# Patient Record
Sex: Male | Born: 1945 | Race: White | Hispanic: No | Marital: Married | Smoking: Former smoker
Health system: Southern US, Community
[De-identification: ages and names within clinical notes are randomized; demographics above are authoritative.]

## PROBLEM LIST (undated history)

## (undated) DIAGNOSIS — J449 Chronic obstructive pulmonary disease, unspecified: Secondary | ICD-10-CM

## (undated) DIAGNOSIS — N189 Chronic kidney disease, unspecified: Secondary | ICD-10-CM

## (undated) DIAGNOSIS — C801 Malignant (primary) neoplasm, unspecified: Secondary | ICD-10-CM

## (undated) DIAGNOSIS — H269 Unspecified cataract: Secondary | ICD-10-CM

## (undated) HISTORY — PX: EYE SURGERY: SHX253

## (undated) HISTORY — PX: BLADDER REMOVAL: SHX567

## (undated) HISTORY — DX: Chronic obstructive pulmonary disease, unspecified: J44.9

## (undated) HISTORY — DX: Unspecified cataract: H26.9

## (undated) HISTORY — PX: TONSILLECTOMY: SUR1361

## (undated) HISTORY — DX: Chronic kidney disease, unspecified: N18.9

## (undated) HISTORY — PX: VASECTOMY: SHX75

## (undated) HISTORY — PX: PROSTATECTOMY: SHX69

## (undated) HISTORY — DX: Malignant (primary) neoplasm, unspecified: C80.1

---

## 1999-05-13 ENCOUNTER — Ambulatory Visit (HOSPITAL_BASED_OUTPATIENT_CLINIC_OR_DEPARTMENT_OTHER): Admission: RE | Admit: 1999-05-13 | Discharge: 1999-05-13 | Payer: Self-pay | Admitting: Urology

## 1999-07-18 ENCOUNTER — Other Ambulatory Visit: Admission: RE | Admit: 1999-07-18 | Discharge: 1999-07-18 | Payer: Self-pay | Admitting: Urology

## 1999-07-24 ENCOUNTER — Encounter: Payer: Self-pay | Admitting: Urology

## 1999-07-24 ENCOUNTER — Encounter: Admission: RE | Admit: 1999-07-24 | Discharge: 1999-07-24 | Payer: Self-pay | Admitting: Urology

## 1999-08-02 ENCOUNTER — Other Ambulatory Visit: Admission: RE | Admit: 1999-08-02 | Discharge: 1999-08-02 | Payer: Self-pay | Admitting: Urology

## 1999-10-07 ENCOUNTER — Emergency Department (HOSPITAL_COMMUNITY): Admission: EM | Admit: 1999-10-07 | Discharge: 1999-10-07 | Payer: Self-pay | Admitting: *Deleted

## 1999-10-09 ENCOUNTER — Encounter: Payer: Self-pay | Admitting: Urology

## 1999-10-09 ENCOUNTER — Observation Stay (HOSPITAL_COMMUNITY): Admission: RE | Admit: 1999-10-09 | Discharge: 1999-10-10 | Payer: Self-pay | Admitting: Urology

## 1999-12-25 ENCOUNTER — Observation Stay (HOSPITAL_COMMUNITY): Admission: RE | Admit: 1999-12-25 | Discharge: 1999-12-26 | Payer: Self-pay | Admitting: Urology

## 2003-12-18 ENCOUNTER — Encounter: Admission: RE | Admit: 2003-12-18 | Discharge: 2004-03-17 | Payer: Self-pay | Admitting: Family Medicine

## 2004-03-18 ENCOUNTER — Encounter: Admission: RE | Admit: 2004-03-18 | Discharge: 2004-03-29 | Payer: Self-pay | Admitting: Family Medicine

## 2011-04-16 ENCOUNTER — Other Ambulatory Visit (HOSPITAL_COMMUNITY): Payer: Self-pay | Admitting: Family Medicine

## 2011-04-16 DIAGNOSIS — R9431 Abnormal electrocardiogram [ECG] [EKG]: Secondary | ICD-10-CM

## 2011-04-18 ENCOUNTER — Ambulatory Visit (HOSPITAL_COMMUNITY): Payer: Medicare Other | Attending: Cardiovascular Disease | Admitting: Radiology

## 2011-04-18 DIAGNOSIS — R5381 Other malaise: Secondary | ICD-10-CM | POA: Insufficient documentation

## 2011-04-18 DIAGNOSIS — R5383 Other fatigue: Secondary | ICD-10-CM | POA: Insufficient documentation

## 2011-04-18 DIAGNOSIS — I079 Rheumatic tricuspid valve disease, unspecified: Secondary | ICD-10-CM | POA: Insufficient documentation

## 2011-04-18 DIAGNOSIS — I059 Rheumatic mitral valve disease, unspecified: Secondary | ICD-10-CM | POA: Insufficient documentation

## 2011-04-18 DIAGNOSIS — I319 Disease of pericardium, unspecified: Secondary | ICD-10-CM | POA: Insufficient documentation

## 2011-04-18 DIAGNOSIS — R9431 Abnormal electrocardiogram [ECG] [EKG]: Secondary | ICD-10-CM | POA: Insufficient documentation

## 2011-04-18 DIAGNOSIS — I379 Nonrheumatic pulmonary valve disorder, unspecified: Secondary | ICD-10-CM | POA: Insufficient documentation

## 2011-04-21 ENCOUNTER — Encounter (HOSPITAL_COMMUNITY): Payer: Self-pay | Admitting: *Deleted

## 2011-11-11 DIAGNOSIS — R197 Diarrhea, unspecified: Secondary | ICD-10-CM | POA: Diagnosis not present

## 2011-11-11 DIAGNOSIS — Z79899 Other long term (current) drug therapy: Secondary | ICD-10-CM | POA: Diagnosis not present

## 2011-11-11 DIAGNOSIS — I10 Essential (primary) hypertension: Secondary | ICD-10-CM | POA: Diagnosis not present

## 2011-11-11 DIAGNOSIS — E785 Hyperlipidemia, unspecified: Secondary | ICD-10-CM | POA: Diagnosis not present

## 2011-11-11 DIAGNOSIS — E559 Vitamin D deficiency, unspecified: Secondary | ICD-10-CM | POA: Diagnosis not present

## 2011-11-25 DIAGNOSIS — R197 Diarrhea, unspecified: Secondary | ICD-10-CM | POA: Diagnosis not present

## 2011-12-09 DIAGNOSIS — L280 Lichen simplex chronicus: Secondary | ICD-10-CM | POA: Diagnosis not present

## 2011-12-09 DIAGNOSIS — L259 Unspecified contact dermatitis, unspecified cause: Secondary | ICD-10-CM | POA: Diagnosis not present

## 2011-12-09 DIAGNOSIS — Z85820 Personal history of malignant melanoma of skin: Secondary | ICD-10-CM | POA: Diagnosis not present

## 2011-12-09 DIAGNOSIS — D235 Other benign neoplasm of skin of trunk: Secondary | ICD-10-CM | POA: Diagnosis not present

## 2011-12-09 DIAGNOSIS — L820 Inflamed seborrheic keratosis: Secondary | ICD-10-CM | POA: Diagnosis not present

## 2011-12-09 DIAGNOSIS — Z85828 Personal history of other malignant neoplasm of skin: Secondary | ICD-10-CM | POA: Diagnosis not present

## 2011-12-09 DIAGNOSIS — D485 Neoplasm of uncertain behavior of skin: Secondary | ICD-10-CM | POA: Diagnosis not present

## 2011-12-24 DIAGNOSIS — K219 Gastro-esophageal reflux disease without esophagitis: Secondary | ICD-10-CM | POA: Diagnosis not present

## 2012-01-19 DIAGNOSIS — E785 Hyperlipidemia, unspecified: Secondary | ICD-10-CM | POA: Diagnosis not present

## 2012-01-19 DIAGNOSIS — I10 Essential (primary) hypertension: Secondary | ICD-10-CM | POA: Diagnosis not present

## 2012-02-17 DIAGNOSIS — C679 Malignant neoplasm of bladder, unspecified: Secondary | ICD-10-CM | POA: Diagnosis not present

## 2012-02-17 DIAGNOSIS — Z8546 Personal history of malignant neoplasm of prostate: Secondary | ICD-10-CM | POA: Diagnosis not present

## 2012-03-30 DIAGNOSIS — N189 Chronic kidney disease, unspecified: Secondary | ICD-10-CM | POA: Diagnosis not present

## 2012-03-30 DIAGNOSIS — I10 Essential (primary) hypertension: Secondary | ICD-10-CM | POA: Diagnosis not present

## 2012-03-30 DIAGNOSIS — E785 Hyperlipidemia, unspecified: Secondary | ICD-10-CM | POA: Diagnosis not present

## 2012-03-30 DIAGNOSIS — N390 Urinary tract infection, site not specified: Secondary | ICD-10-CM | POA: Diagnosis not present

## 2012-03-30 DIAGNOSIS — Z125 Encounter for screening for malignant neoplasm of prostate: Secondary | ICD-10-CM | POA: Diagnosis not present

## 2012-03-30 DIAGNOSIS — Z79899 Other long term (current) drug therapy: Secondary | ICD-10-CM | POA: Diagnosis not present

## 2012-05-03 DIAGNOSIS — R42 Dizziness and giddiness: Secondary | ICD-10-CM | POA: Diagnosis not present

## 2012-05-03 DIAGNOSIS — C679 Malignant neoplasm of bladder, unspecified: Secondary | ICD-10-CM | POA: Diagnosis not present

## 2012-05-27 DIAGNOSIS — C679 Malignant neoplasm of bladder, unspecified: Secondary | ICD-10-CM | POA: Diagnosis not present

## 2012-06-23 DIAGNOSIS — K219 Gastro-esophageal reflux disease without esophagitis: Secondary | ICD-10-CM | POA: Diagnosis not present

## 2012-07-26 DIAGNOSIS — Z23 Encounter for immunization: Secondary | ICD-10-CM | POA: Diagnosis not present

## 2012-08-25 DIAGNOSIS — K2270 Barrett's esophagus without dysplasia: Secondary | ICD-10-CM | POA: Diagnosis not present

## 2012-09-21 DIAGNOSIS — A048 Other specified bacterial intestinal infections: Secondary | ICD-10-CM | POA: Diagnosis not present

## 2012-09-21 DIAGNOSIS — Z01818 Encounter for other preprocedural examination: Secondary | ICD-10-CM | POA: Diagnosis not present

## 2012-09-21 DIAGNOSIS — K2270 Barrett's esophagus without dysplasia: Secondary | ICD-10-CM | POA: Diagnosis not present

## 2012-10-01 DIAGNOSIS — K2270 Barrett's esophagus without dysplasia: Secondary | ICD-10-CM | POA: Diagnosis not present

## 2012-10-01 DIAGNOSIS — K200 Eosinophilic esophagitis: Secondary | ICD-10-CM | POA: Diagnosis not present

## 2012-10-01 DIAGNOSIS — K2970 Gastritis, unspecified, without bleeding: Secondary | ICD-10-CM | POA: Diagnosis not present

## 2012-10-01 DIAGNOSIS — A048 Other specified bacterial intestinal infections: Secondary | ICD-10-CM | POA: Diagnosis not present

## 2012-10-01 DIAGNOSIS — K2990 Gastroduodenitis, unspecified, without bleeding: Secondary | ICD-10-CM | POA: Diagnosis not present

## 2012-10-15 DIAGNOSIS — K219 Gastro-esophageal reflux disease without esophagitis: Secondary | ICD-10-CM | POA: Diagnosis not present

## 2012-10-15 DIAGNOSIS — K2270 Barrett's esophagus without dysplasia: Secondary | ICD-10-CM | POA: Diagnosis not present

## 2012-10-26 DIAGNOSIS — L509 Urticaria, unspecified: Secondary | ICD-10-CM | POA: Diagnosis not present

## 2012-10-31 DIAGNOSIS — R21 Rash and other nonspecific skin eruption: Secondary | ICD-10-CM | POA: Diagnosis not present

## 2012-10-31 DIAGNOSIS — E7800 Pure hypercholesterolemia, unspecified: Secondary | ICD-10-CM | POA: Diagnosis not present

## 2012-10-31 DIAGNOSIS — Z9889 Other specified postprocedural states: Secondary | ICD-10-CM | POA: Diagnosis not present

## 2012-10-31 DIAGNOSIS — K219 Gastro-esophageal reflux disease without esophagitis: Secondary | ICD-10-CM | POA: Diagnosis not present

## 2012-10-31 DIAGNOSIS — I10 Essential (primary) hypertension: Secondary | ICD-10-CM | POA: Diagnosis not present

## 2012-10-31 DIAGNOSIS — Z859 Personal history of malignant neoplasm, unspecified: Secondary | ICD-10-CM | POA: Diagnosis not present

## 2012-10-31 DIAGNOSIS — T7840XA Allergy, unspecified, initial encounter: Secondary | ICD-10-CM | POA: Diagnosis not present

## 2012-10-31 DIAGNOSIS — Z9089 Acquired absence of other organs: Secondary | ICD-10-CM | POA: Diagnosis not present

## 2013-01-30 ENCOUNTER — Other Ambulatory Visit: Payer: Self-pay | Admitting: Family Medicine

## 2013-01-31 NOTE — Telephone Encounter (Signed)
Rx called to [facility].  Daughter aware.

## 2013-01-31 NOTE — Telephone Encounter (Signed)
LAST OV FOR CHECKUP 3/13. LAST OV FOR RASH 1/14

## 2013-01-31 NOTE — Telephone Encounter (Signed)
Okay to refill? 

## 2013-02-21 ENCOUNTER — Other Ambulatory Visit: Payer: Self-pay | Admitting: Nurse Practitioner

## 2013-02-21 ENCOUNTER — Telehealth: Payer: Self-pay | Admitting: *Deleted

## 2013-02-21 MED ORDER — DOXYCYCLINE HYCLATE 100 MG PO TABS: 100.0000 mg | ORAL_TABLET | Freq: Two times a day (BID) | ORAL | Status: DC

## 2013-02-21 NOTE — Telephone Encounter (Signed)
Pt aware.

## 2013-02-21 NOTE — Telephone Encounter (Signed)
Pt has removed 2 ticks and the area is really red and swollen can we please call in antibiotic.

## 2013-02-21 NOTE — Telephone Encounter (Signed)
rx for doxycycline called in

## 2013-03-25 DIAGNOSIS — M542 Cervicalgia: Secondary | ICD-10-CM | POA: Diagnosis not present

## 2013-03-25 DIAGNOSIS — Z9079 Acquired absence of other genital organ(s): Secondary | ICD-10-CM | POA: Diagnosis not present

## 2013-03-25 DIAGNOSIS — K219 Gastro-esophageal reflux disease without esophagitis: Secondary | ICD-10-CM | POA: Diagnosis not present

## 2013-03-25 DIAGNOSIS — R6884 Jaw pain: Secondary | ICD-10-CM | POA: Diagnosis not present

## 2013-03-25 DIAGNOSIS — E785 Hyperlipidemia, unspecified: Secondary | ICD-10-CM | POA: Diagnosis not present

## 2013-03-25 DIAGNOSIS — Z8249 Family history of ischemic heart disease and other diseases of the circulatory system: Secondary | ICD-10-CM | POA: Diagnosis not present

## 2013-03-25 DIAGNOSIS — Z79899 Other long term (current) drug therapy: Secondary | ICD-10-CM | POA: Diagnosis not present

## 2013-03-25 DIAGNOSIS — R072 Precordial pain: Secondary | ICD-10-CM | POA: Diagnosis not present

## 2013-03-25 DIAGNOSIS — E7800 Pure hypercholesterolemia, unspecified: Secondary | ICD-10-CM | POA: Diagnosis not present

## 2013-03-25 DIAGNOSIS — R0789 Other chest pain: Secondary | ICD-10-CM | POA: Diagnosis not present

## 2013-03-25 DIAGNOSIS — I10 Essential (primary) hypertension: Secondary | ICD-10-CM | POA: Diagnosis not present

## 2013-03-25 DIAGNOSIS — Z8509 Personal history of malignant neoplasm of other digestive organs: Secondary | ICD-10-CM | POA: Diagnosis not present

## 2013-03-25 DIAGNOSIS — F17200 Nicotine dependence, unspecified, uncomplicated: Secondary | ICD-10-CM | POA: Diagnosis not present

## 2013-03-25 DIAGNOSIS — R079 Chest pain, unspecified: Secondary | ICD-10-CM | POA: Diagnosis not present

## 2013-03-25 DIAGNOSIS — Z8551 Personal history of malignant neoplasm of bladder: Secondary | ICD-10-CM | POA: Diagnosis not present

## 2013-03-25 DIAGNOSIS — Z8546 Personal history of malignant neoplasm of prostate: Secondary | ICD-10-CM | POA: Diagnosis not present

## 2013-04-11 DIAGNOSIS — K219 Gastro-esophageal reflux disease without esophagitis: Secondary | ICD-10-CM | POA: Diagnosis not present

## 2013-04-11 DIAGNOSIS — K2270 Barrett's esophagus without dysplasia: Secondary | ICD-10-CM | POA: Diagnosis not present

## 2013-04-11 DIAGNOSIS — Z86010 Personal history of colonic polyps: Secondary | ICD-10-CM | POA: Diagnosis not present

## 2013-04-11 DIAGNOSIS — Z79899 Other long term (current) drug therapy: Secondary | ICD-10-CM | POA: Diagnosis not present

## 2013-05-02 DIAGNOSIS — J309 Allergic rhinitis, unspecified: Secondary | ICD-10-CM | POA: Diagnosis not present

## 2013-05-02 DIAGNOSIS — R197 Diarrhea, unspecified: Secondary | ICD-10-CM | POA: Diagnosis not present

## 2013-05-09 DIAGNOSIS — D129 Benign neoplasm of anus and anal canal: Secondary | ICD-10-CM | POA: Diagnosis not present

## 2013-05-09 DIAGNOSIS — Z01818 Encounter for other preprocedural examination: Secondary | ICD-10-CM | POA: Diagnosis not present

## 2013-05-09 DIAGNOSIS — K5289 Other specified noninfective gastroenteritis and colitis: Secondary | ICD-10-CM | POA: Diagnosis not present

## 2013-05-09 DIAGNOSIS — D128 Benign neoplasm of rectum: Secondary | ICD-10-CM | POA: Diagnosis not present

## 2013-05-09 DIAGNOSIS — K6389 Other specified diseases of intestine: Secondary | ICD-10-CM | POA: Diagnosis not present

## 2013-05-09 DIAGNOSIS — D126 Benign neoplasm of colon, unspecified: Secondary | ICD-10-CM | POA: Diagnosis not present

## 2013-05-23 DIAGNOSIS — Z800 Family history of malignant neoplasm of digestive organs: Secondary | ICD-10-CM | POA: Diagnosis not present

## 2013-05-23 DIAGNOSIS — K2270 Barrett's esophagus without dysplasia: Secondary | ICD-10-CM | POA: Diagnosis not present

## 2013-05-23 DIAGNOSIS — K219 Gastro-esophageal reflux disease without esophagitis: Secondary | ICD-10-CM | POA: Diagnosis not present

## 2013-05-23 DIAGNOSIS — Z86010 Personal history of colonic polyps: Secondary | ICD-10-CM | POA: Diagnosis not present

## 2013-06-30 ENCOUNTER — Ambulatory Visit (INDEPENDENT_AMBULATORY_CARE_PROVIDER_SITE_OTHER): Payer: Medicare Other | Admitting: Nurse Practitioner

## 2013-06-30 ENCOUNTER — Encounter: Payer: Self-pay | Admitting: Nurse Practitioner

## 2013-06-30 VITALS — BP 145/81 | HR 67 | Temp 98.3°F | Ht 69.0 in | Wt 232.4 lb

## 2013-06-30 DIAGNOSIS — I10 Essential (primary) hypertension: Secondary | ICD-10-CM | POA: Insufficient documentation

## 2013-06-30 DIAGNOSIS — K5190 Ulcerative colitis, unspecified, without complications: Secondary | ICD-10-CM | POA: Diagnosis not present

## 2013-06-30 DIAGNOSIS — K219 Gastro-esophageal reflux disease without esophagitis: Secondary | ICD-10-CM | POA: Diagnosis not present

## 2013-06-30 DIAGNOSIS — Z23 Encounter for immunization: Secondary | ICD-10-CM | POA: Diagnosis not present

## 2013-06-30 DIAGNOSIS — E785 Hyperlipidemia, unspecified: Secondary | ICD-10-CM

## 2013-06-30 MED ORDER — LISINOPRIL-HYDROCHLOROTHIAZIDE 10-12.5 MG PO TABS: 1.0000 | ORAL_TABLET | Freq: Every day | ORAL | Status: DC

## 2013-06-30 MED ORDER — ATORVASTATIN CALCIUM 20 MG PO TABS: 20.0000 mg | ORAL_TABLET | Freq: Every day | ORAL | Status: DC

## 2013-06-30 MED ORDER — OMEPRAZOLE 40 MG PO CPDR: 40.0000 mg | DELAYED_RELEASE_CAPSULE | Freq: Every day | ORAL | Status: DC

## 2013-06-30 MED ORDER — VARENICLINE TARTRATE 0.5 MG X 11 & 1 MG X 42 PO MISC: ORAL | Status: DC

## 2013-06-30 MED ORDER — VARENICLINE TARTRATE 1 MG PO TABS: 1.0000 mg | ORAL_TABLET | Freq: Two times a day (BID) | ORAL | Status: DC

## 2013-06-30 NOTE — Progress Notes (Signed)
Subjective:    Patient ID: Scott Bird, male    DOB: 11-11-1945, 67 y.o.   MRN: [id number]  Hypertension This is a chronic problem. The current episode started more than 1 year ago. The problem is unchanged. The problem is controlled (running in the 120's systolic at home). Pertinent negatives include no blurred vision, chest pain, headaches, neck pain, palpitations, peripheral edema, shortness of breath or sweats. Risk factors for coronary artery disease include family history, dyslipidemia and male gender. Past treatments include ACE inhibitors and diuretics. The current treatment provides moderate improvement. Compliance problems include diet and exercise.  Hypertensive end-organ damage includes CAD/MI.  Hyperlipidemia This is a chronic problem. The current episode started more than 1 year ago. The problem is controlled. Recent lipid tests were reviewed and are normal. He has no history of diabetes, hypothyroidism or obesity. Factors aggravating his hyperlipidemia include thiazides. Pertinent negatives include no chest pain or shortness of breath. Current antihyperlipidemic treatment includes statins. The current treatment provides moderate improvement of lipids. Compliance problems include adherence to diet and adherence to exercise.  Risk factors for coronary artery disease include hypertension and male sex.  GERD Omeprazole working well to keep symptoms under control. Ulcerative colitis Patient is currently on lialda 1.2gm which he does not see any difference- he has intermittent diarrhea. He stooped it for the last week and hasn't had any problems. Has follow up with GI in 1 week.  Review of Systems  HENT: Negative for neck pain.   Eyes: Negative for blurred vision.  Respiratory: Negative for shortness of breath.   Cardiovascular: Negative for chest pain and palpitations.  Neurological: Negative for headaches.       Objective:   Physical Exam  Constitutional: He is oriented to  person, place, and time. He appears well-developed and well-nourished.  HENT:  Head: Normocephalic.  Right Ear: External ear normal.  Left Ear: External ear normal.  Nose: Nose normal.  Mouth/Throat: Oropharynx is clear and moist.  Eyes: EOM are normal. Pupils are equal, round, and reactive to light.  Neck: Normal range of motion. Neck supple. No thyromegaly present.  Cardiovascular: Normal rate, regular rhythm, normal heart sounds and intact distal pulses.   No murmur heard. Pulmonary/Chest: Effort normal and breath sounds normal. He has no wheezes. He has no rales.  Abdominal: Soft. Bowel sounds are normal.  Genitourinary:  Rectal exam done by urologist  Musculoskeletal: Normal range of motion.  Neurological: He is alert and oriented to person, place, and time.  Skin: Skin is warm and dry.  Psychiatric: He has a normal mood and affect. His behavior is normal. Judgment and thought content normal.   BP 145/81  Pulse 67  Temp(Src) 98.3 F (36.8 C) (Oral)  Ht 5\' 9"  (1.753 m)  Wt 232 lb 6.4 oz (105.416 kg)  BMI 34.3 kg/m2        Assessment & Plan:   1. Hypertension   2. Hyperlipidemia   3. GERD (gastroesophageal reflux disease)   4. Ulcerative colitis    Orders Placed This Encounter  Procedures  . CMP14+EGFR  . NMR, lipoprofile   Meds ordered this encounter  Medications  . DISCONTD: omeprazole (PRILOSEC) 40 MG capsule    Sig:   . DISCONTD: atorvastatin (LIPITOR) 20 MG tablet    Sig: Take 20 mg by mouth daily.  . mesalamine (LIALDA) 1.2 G EC tablet    Sig: Take 1,200 mg by mouth daily with breakfast.  . omeprazole (PRILOSEC) 40 MG capsule  Sig: Take 1 capsule (40 mg total) by mouth daily.    Dispense:  90 capsule    Refill:  1    Order Specific Question:  Supervising Provider    Answer:  Ernestina Penna [[id number]]  . lisinopril-hydrochlorothiazide (PRINZIDE,ZESTORETIC) 10-12.5 MG per tablet    Sig: Take 1 tablet by mouth daily.    Dispense:  90 tablet    Refill:   1    Order Specific Question:  Supervising Provider    Answer:  Ernestina Penna [[id number]]  . atorvastatin (LIPITOR) 20 MG tablet    Sig: Take 1 tablet (20 mg total) by mouth daily.    Dispense:  90 tablet    Refill:  1    Order Specific Question:  Supervising Provider    Answer:  Ernestina Penna [[id number]]  . varenicline (CHANTIX STARTING MONTH PAK) 0.5 MG X 11 & 1 MG X 42 tablet    Sig: Take one 0.5 mg tablet by mouth once daily for 3 days, then increase to one 0.5 mg tablet twice daily for 4 days, then increase to one 1 mg tablet twice daily.    Dispense:  53 tablet    Refill:  0    Order Specific Question:  Supervising Provider    Answer:  Ernestina Penna [[id number]]  . varenicline (CHANTIX CONTINUING MONTH PAK) 1 MG tablet    Sig: Take 1 tablet (1 mg total) by mouth 2 (two) times daily.    Dispense:  1 tablet    Refill:  3    Order Specific Question:  Supervising Provider    Answer:  Deborra Medina    Continue all meds Labs pending Diet and exercise encouraged Health maintenance reviewed Follow up in 3 months  Mary-Margaret Daphine Deutscher, FNP

## 2013-06-30 NOTE — Patient Instructions (Signed)
Health Maintenance, Males A healthy lifestyle and preventative care can promote health and wellness.  Maintain regular health, dental, and eye exams.  Eat a healthy diet. Foods like vegetables, fruits, whole grains, low-fat dairy products, and lean protein foods contain the nutrients you need without too many calories. Decrease your intake of foods high in solid fats, added sugars, and salt. Get information about a proper diet from your caregiver, if necessary.  Regular physical exercise is one of the most important things you can do for your health. Most adults should get at least 150 minutes of moderate-intensity exercise (any activity that increases your heart rate and causes you to sweat) each week. In addition, most adults need muscle-strengthening exercises on 2 or more days a week.   Maintain a healthy weight. The body mass index (BMI) is a screening tool to identify possible weight problems. It provides an estimate of body fat based on height and weight. Your caregiver can help determine your BMI, and can help you achieve or maintain a healthy weight. For adults 20 years and older:  A BMI below 18.5 is considered underweight.  A BMI of 18.5 to 24.9 is normal.  A BMI of 25 to 29.9 is considered overweight.  A BMI of 30 and above is considered obese.  Maintain normal blood lipids and cholesterol by exercising and minimizing your intake of saturated fat. Eat a balanced diet with plenty of fruits and vegetables. Blood tests for lipids and cholesterol should begin at age 20 and be repeated every 5 years. If your lipid or cholesterol levels are high, you are over 50, or you are a high risk for heart disease, you may need your cholesterol levels checked more frequently.Ongoing high lipid and cholesterol levels should be treated with medicines, if diet and exercise are not effective.  If you smoke, find out from your caregiver how to quit. If you do not use tobacco, do not start.  If you  choose to drink alcohol, do not exceed 2 drinks per day. One drink is considered to be 12 ounces (355 mL) of beer, 5 ounces (148 mL) of wine, or 1.5 ounces (44 mL) of liquor.  Avoid use of street drugs. Do not share needles with anyone. Ask for help if you need support or instructions about stopping the use of drugs.  High blood pressure causes heart disease and increases the risk of stroke. Blood pressure should be checked at least every 1 to 2 years. Ongoing high blood pressure should be treated with medicines if weight loss and exercise are not effective.  If you are 45 to 68 years old, ask your caregiver if you should take aspirin to prevent heart disease.  Diabetes screening involves taking a blood sample to check your fasting blood sugar level. This should be done once every 3 years, after age 45, if you are within normal weight and without risk factors for diabetes. Testing should be considered at a younger age or be carried out more frequently if you are overweight and have at least 1 risk factor for diabetes.  Colorectal cancer can be detected and often prevented. Most routine colorectal cancer screening begins at the age of 50 and continues through age 75. However, your caregiver may recommend screening at an earlier age if you have risk factors for colon cancer. On a yearly basis, your caregiver may provide home test kits to check for hidden blood in the stool. Use of a small camera at the end of a tube,   to directly examine the colon (sigmoidoscopy or colonoscopy), can detect the earliest forms of colorectal cancer. Talk to your caregiver about this at age 50, when routine screening begins. Direct examination of the colon should be repeated every 5 to 10 years through age 75, unless early forms of pre-cancerous polyps or small growths are found.  Hepatitis C blood testing is recommended for all people born from 1945 through 1965 and any individual with known risks for hepatitis C.  Healthy  men should no longer receive prostate-specific antigen (PSA) blood tests as part of routine cancer screening. Consult with your caregiver about prostate cancer screening.  Testicular cancer screening is not recommended for adolescents or adult males who have no symptoms. Screening includes self-exam, caregiver exam, and other screening tests. Consult with your caregiver about any symptoms you have or any concerns you have about testicular cancer.  Practice safe sex. Use condoms and avoid high-risk sexual practices to reduce the spread of sexually transmitted infections (STIs).  Use sunscreen with a sun protection factor (SPF) of 30 or greater. Apply sunscreen liberally and repeatedly throughout the day. You should seek shade when your shadow is shorter than you. Protect yourself by wearing long sleeves, pants, a wide-brimmed hat, and sunglasses year round, whenever you are outdoors.  Notify your caregiver of new moles or changes in moles, especially if there is a change in shape or color. Also notify your caregiver if a mole is larger than the size of a pencil eraser.  A one-time screening for abdominal aortic aneurysm (AAA) and surgical repair of large AAAs by sound wave imaging (ultrasonography) is recommended for ages 65 to 75 years who are current or former smokers.  Stay current with your immunizations. Document Released: 03/20/2008 Document Revised: 12/15/2011 Document Reviewed: 02/17/2011 [employer] Patient Information 2014 [employer].  

## 2013-07-01 ENCOUNTER — Other Ambulatory Visit: Payer: Self-pay | Admitting: Nurse Practitioner

## 2013-07-01 LAB — NMR, LIPOPROFILE
LDL Particle Number: 2036 nmol/L — ABNORMAL HIGH (ref <1000)
LDL Size: 19.6 nm — ABNORMAL LOW (ref >20.5)
LP-IR Score: 73 — ABNORMAL HIGH (ref <=45)

## 2013-07-01 LAB — CMP14+EGFR
ALT: 25 IU/L (ref 0–44)
AST: 21 IU/L (ref 0–40)
Alkaline Phosphatase: 64 IU/L (ref 39–117)
BUN/Creatinine Ratio: 10 (ref 10–22)
Chloride: 103 mmol/L (ref 97–108)
GFR calc Af Amer: 51 mL/min/{1.73_m2} — ABNORMAL LOW (ref >59)
GFR calc non Af Amer: 44 mL/min/{1.73_m2} — ABNORMAL LOW (ref >59)
Glucose: 90 mg/dL (ref 65–99)
Potassium: 4.6 mmol/L (ref 3.5–5.2)
Sodium: 145 mmol/L — ABNORMAL HIGH (ref 134–144)
Total Bilirubin: 0.5 mg/dL (ref 0.0–1.2)

## 2013-07-01 MED ORDER — FENOFIBRATE 145 MG PO TABS: 145.0000 mg | ORAL_TABLET | Freq: Every day | ORAL | Status: DC

## 2013-07-01 MED ORDER — ATORVASTATIN CALCIUM 40 MG PO TABS: 40.0000 mg | ORAL_TABLET | Freq: Every day | ORAL | Status: DC

## 2013-08-08 DIAGNOSIS — K5289 Other specified noninfective gastroenteritis and colitis: Secondary | ICD-10-CM | POA: Diagnosis not present

## 2013-08-08 DIAGNOSIS — Z8371 Family history of colonic polyps: Secondary | ICD-10-CM | POA: Diagnosis not present

## 2013-08-08 DIAGNOSIS — K219 Gastro-esophageal reflux disease without esophagitis: Secondary | ICD-10-CM | POA: Diagnosis not present

## 2013-08-08 DIAGNOSIS — Z800 Family history of malignant neoplasm of digestive organs: Secondary | ICD-10-CM | POA: Diagnosis not present

## 2014-01-13 ENCOUNTER — Other Ambulatory Visit: Payer: Self-pay | Admitting: *Deleted

## 2014-01-13 MED ORDER — OMEPRAZOLE 40 MG PO CPDR: 40.0000 mg | DELAYED_RELEASE_CAPSULE | Freq: Every day | ORAL | Status: DC

## 2014-01-19 ENCOUNTER — Other Ambulatory Visit: Payer: Self-pay | Admitting: Nurse Practitioner

## 2014-01-19 ENCOUNTER — Other Ambulatory Visit: Payer: Self-pay | Admitting: *Deleted

## 2014-01-19 ENCOUNTER — Ambulatory Visit (INDEPENDENT_AMBULATORY_CARE_PROVIDER_SITE_OTHER): Payer: Medicare Other

## 2014-01-19 ENCOUNTER — Encounter: Payer: Self-pay | Admitting: Nurse Practitioner

## 2014-01-19 ENCOUNTER — Encounter (HOSPITAL_COMMUNITY): Payer: Self-pay

## 2014-01-19 ENCOUNTER — Ambulatory Visit (HOSPITAL_COMMUNITY)
Admission: RE | Admit: 2014-01-19 | Discharge: 2014-01-19 | Disposition: A | Discharge status: Home / Self Care | Payer: Medicare Other | Source: Ambulatory Visit | Attending: Nurse Practitioner | Admitting: Nurse Practitioner

## 2014-01-19 ENCOUNTER — Ambulatory Visit (INDEPENDENT_AMBULATORY_CARE_PROVIDER_SITE_OTHER): Payer: Medicare Other | Admitting: Nurse Practitioner

## 2014-01-19 VITALS — BP 152/80 | HR 71 | Temp 98.2°F | Ht 69.0 in | Wt 238.2 lb

## 2014-01-19 DIAGNOSIS — R918 Other nonspecific abnormal finding of lung field: Secondary | ICD-10-CM | POA: Diagnosis not present

## 2014-01-19 DIAGNOSIS — E785 Hyperlipidemia, unspecified: Secondary | ICD-10-CM | POA: Diagnosis not present

## 2014-01-19 DIAGNOSIS — R911 Solitary pulmonary nodule: Secondary | ICD-10-CM

## 2014-01-19 DIAGNOSIS — K219 Gastro-esophageal reflux disease without esophagitis: Secondary | ICD-10-CM | POA: Diagnosis not present

## 2014-01-19 DIAGNOSIS — Z8546 Personal history of malignant neoplasm of prostate: Secondary | ICD-10-CM | POA: Diagnosis not present

## 2014-01-19 DIAGNOSIS — F17200 Nicotine dependence, unspecified, uncomplicated: Secondary | ICD-10-CM | POA: Diagnosis not present

## 2014-01-19 DIAGNOSIS — R5383 Other fatigue: Secondary | ICD-10-CM | POA: Diagnosis not present

## 2014-01-19 DIAGNOSIS — Z8551 Personal history of malignant neoplasm of bladder: Secondary | ICD-10-CM | POA: Insufficient documentation

## 2014-01-19 DIAGNOSIS — I10 Essential (primary) hypertension: Secondary | ICD-10-CM

## 2014-01-19 DIAGNOSIS — J438 Other emphysema: Secondary | ICD-10-CM | POA: Diagnosis not present

## 2014-01-19 DIAGNOSIS — R5381 Other malaise: Secondary | ICD-10-CM | POA: Diagnosis not present

## 2014-01-19 DIAGNOSIS — R222 Localized swelling, mass and lump, trunk: Secondary | ICD-10-CM | POA: Diagnosis not present

## 2014-01-19 DIAGNOSIS — E531 Pyridoxine deficiency: Secondary | ICD-10-CM | POA: Diagnosis not present

## 2014-01-19 DIAGNOSIS — Z85068 Personal history of other malignant neoplasm of small intestine: Secondary | ICD-10-CM | POA: Insufficient documentation

## 2014-01-19 DIAGNOSIS — C3490 Malignant neoplasm of unspecified part of unspecified bronchus or lung: Secondary | ICD-10-CM

## 2014-01-19 LAB — POCT I-STAT CREATININE: Creatinine, Ser: 2.5 mg/dL — ABNORMAL HIGH (ref 0.50–1.35)

## 2014-01-19 MED ORDER — LISINOPRIL-HYDROCHLOROTHIAZIDE 20-12.5 MG PO TABS
1.0000 | ORAL_TABLET | Freq: Every day | ORAL | Status: DC
Start: 2014-01-19 — End: 2015-09-15

## 2014-01-19 MED ORDER — IOHEXOL 300 MG/ML  SOLN: 100.0000 mL | Freq: Once | INTRAMUSCULAR | Status: DC | PRN

## 2014-01-19 NOTE — Progress Notes (Signed)
Subjective:    Patient ID: Scott Bird, male    DOB: 1946/06/13, 68 y.o.   MRN: [id number]  HPI Patient presents today complaining of fatigue for past 2 months. Patient states he feels fatigue has gotten worse. Patient states he does not feel like he does not have any energy, does not feel like getting off the couch, and has little interest in doing things he used. Denies life changes. Appetite and sleep habits have not changed. Patient also states that "I get down on myself for not being able to do things I used to could do."  Associated symptoms include dizziness with standing or walking (room does not spin), weakness,    Review of Systems  Constitutional: Positive for activity change and fatigue.  Respiratory: Negative for shortness of breath.   Cardiovascular: Negative for chest pain and palpitations.  Neurological: Positive for dizziness and light-headedness.  Psychiatric/Behavioral: Negative for sleep disturbance. The patient is not nervous/anxious.   All other systems reviewed and are negative.      Objective:   Physical Exam  Constitutional: He is oriented to person, place, and time. He appears well-developed and well-nourished.  Cardiovascular: Normal rate, regular rhythm and normal heart sounds.   Pulmonary/Chest: Effort normal. Wheezes: Expiratory.  Abdominal: Soft. Bowel sounds are normal.  Large umbilical hernia  Musculoskeletal: Normal range of motion.  Neurological: He is alert and oriented to person, place, and time.  Skin: Skin is warm and dry.  Psychiatric: He has a normal mood and affect. His behavior is normal. Judgment and thought content normal.     BP 152/80  Pulse 71  Temp(Src) 98.2 F (36.8 C) (Oral)  Ht _0  (1.753 m)  Wt 238 lb 3.2 oz (108.047 kg)  BMI 35.16 kg/m2  PHQ Score of 10 Chest X-ray:- chronic bronchitic changes-Preliminary reading by Ronnald Collum, FNP  [facility] EKG:- NSR with first degree av block-Mary-Margaret Hassell Done, FNP       Assessment & Plan:   1. Hypertension   2. Hyperlipidemia   3. GERD (gastroesophageal reflux disease)   4. H/O prostate cancer   5. Other malaise and fatigue   6. Smoker     Orders Placed This Encounter  Procedures  . DG Chest 2 View    Standing Status: Future     Number of Occurrences:      Standing Expiration Date: 03/21/2015    Order Specific Question:  Reason for Exam (SYMPTOM  OR DIAGNOSIS REQUIRED)    Answer:  hx smoking    Order Specific Question:  Preferred imaging location?    Answer:  Internal  . Anemia Profile B  . Thyroid Panel With TSH  . Testosterone,Free and Total    Standing Status: Future     Number of Occurrences:      Standing Expiration Date: 01/20/2015  . CMP14+EGFR  . NMR, lipoprofile  . PSA, total and free  . EKG 12-Lead   Meds ordered this encounter  Medications  . cholecalciferol (VITAMIN D) 1000 UNITS tablet    Sig: Take 1,000 Units by mouth daily.  Marland Kitchen lisinopril-hydrochlorothiazide (ZESTORETIC) 20-12.5 MG per tablet    Sig: Take 1 tablet by mouth daily.    Dispense:  90 tablet    Refill:  1    Order Specific Question:  Supervising Provider    Answer:  Chipper Herb [[id number]]   STOP SMOKING!!! Increased lisinopril from 10/12.5 to 20/12.5 daily Discussed medication for depression - patient is not interested at this time  Stress management  Labs pending- patient will rto for testosterone level Health maintenance reviewed Diet and exercise encouraged Continue all meds Follow up  In 3 months   Allen, FNP

## 2014-01-19 NOTE — Patient Instructions (Addendum)
Stress Management Stress is a state of physical or mental tension that often results from changes in your life or normal routine. Some common causes of stress are:  Death of a loved one.  Injuries or severe illnesses.  Getting fired or changing jobs.  Moving into a new home. Other causes may be:  Sexual problems.  Business or financial losses.  Taking on a large debt.  Regular conflict with someone at home or at work.  Constant tiredness from lack of sleep. It is not just bad things that are stressful. It may be stressful to:  Win the lottery.  Get married.  Buy a new car. The amount of stress that can be easily tolerated varies from person to person. Changes generally cause stress, regardless of the types of change. Too much stress can affect your health. It may lead to physical or emotional problems. Too little stress (boredom) may also become stressful. SUGGESTIONS TO REDUCE STRESS:  Talk things over with your family and friends. It often is helpful to share your concerns and worries. If you feel your problem is serious, you may want to get help from a professional counselor.  Consider your problems one at a time instead of lumping them all together. Trying to take care of everything at once may seem impossible. List all the things you need to do and then start with the most important one. Set a goal to accomplish 2 or 3 things each day. If you expect to do too many in a single day you will naturally fail, causing you to feel even more stressed.  Do not use alcohol or drugs to relieve stress. Although you may feel better for a short time, they do not remove the problems that caused the stress. They can also be habit forming.  Exercise regularly - at least 3 times per week. Physical exercise can help to relieve that "uptight" feeling and will relax you.  The shortest distance between despair and hope is often a good night's sleep.  Go to bed and get up on time allowing  yourself time for appointments without being rushed.  Take a short "time-out" period from any stressful situation that occurs during the day. Close your eyes and take some deep breaths. Starting with the muscles in your face, tense them, hold it for a few seconds, then relax. Repeat this with the muscles in your neck, shoulders, hand, stomach, back and legs.  Take good care of yourself. Eat a balanced diet and get plenty of rest.  Schedule time for having fun. Take a break from your daily routine to relax. HOME CARE INSTRUCTIONS   Call if you feel overwhelmed by your problems and feel you can no longer manage them on your own.  Return immediately if you feel like hurting yourself or someone else. Document Released: 03/18/2001 Document Revised: 12/15/2011 Document Reviewed: 05/17/2013 [facility] Patient Information 2014 [place], [place]. Smoking Cessation Quitting smoking is important to your health and has many advantages. However, it is not always easy to quit since nicotine is a very addictive drug. Often times, people try 3 times or more before being able to quit. This document explains the best ways for you to prepare to quit smoking. Quitting takes hard work and a lot of effort, but you can do it. ADVANTAGES OF QUITTING SMOKING  You will live longer, feel better, and live better.  Your body will feel the impact of quitting smoking almost immediately.  Within 20 minutes, blood pressure decreases. Your  pulse returns to its normal level.  After 8 hours, carbon monoxide levels in the blood return to normal. Your oxygen level increases.  After 24 hours, the chance of having a heart attack starts to decrease. Your breath, hair, and body stop smelling like smoke.  After 48 hours, damaged nerve endings begin to recover. Your sense of taste and smell improve.  After 72 hours, the body is virtually free of nicotine. Your bronchial tubes relax and breathing becomes easier.  After 2 to 12  weeks, lungs can hold more air. Exercise becomes easier and circulation improves.  The risk of having a heart attack, stroke, cancer, or lung disease is greatly reduced.  After 1 year, the risk of coronary heart disease is cut in half.  After 5 years, the risk of stroke falls to the same as a nonsmoker.  After 10 years, the risk of lung cancer is cut in half and the risk of other cancers decreases significantly.  After 15 years, the risk of coronary heart disease drops, usually to the level of a nonsmoker.  If you are pregnant, quitting smoking will improve your chances of having a healthy baby.  The people you live with, especially any children, will be healthier.  You will have extra money to spend on things other than cigarettes. QUESTIONS TO THINK ABOUT BEFORE ATTEMPTING TO QUIT You may want to talk about your answers with your caregiver.  Why do you want to quit?  If you tried to quit in the past, what helped and what did not?  What will be the most difficult situations for you after you quit? How will you plan to handle them?  Who can help you through the tough times? Your family? Friends? A caregiver?  What pleasures do you get from smoking? What ways can you still get pleasure if you quit? Here are some questions to ask your caregiver:  How can you help me to be successful at quitting?  What medicine do you think would be best for me and how should I take it?  What should I do if I need more help?  What is smoking withdrawal like? How can I get information on withdrawal? GET READY  Set a quit date.  Change your environment by getting rid of all cigarettes, ashtrays, matches, and lighters in your home, car, or work. Do not let people smoke in your home.  Review your past attempts to quit. Think about what worked and what did not. GET SUPPORT AND ENCOURAGEMENT You have a better chance of being successful if you have help. You can get support in many ways.  Tell  your family, friends, and co-workers that you are going to quit and need their support. Ask them not to smoke around you.  Get individual, group, or telephone counseling and support. Programs are available at [employer] and health centers. Call your local health department for information about programs in your area.  Spiritual beliefs and practices may help some smokers quit.  Download a "quit meter" on your computer to keep track of quit statistics, such as how long you have gone without smoking, cigarettes not smoked, and money saved.  Get a self-help book about quitting smoking and staying off of tobacco. [place] yourself from urges to smoke. Talk to someone, go for a walk, or occupy your time with a task.  Change your normal routine. Take a different route to work. Drink tea instead of coffee. Eat  breakfast in a different place.  Reduce your stress. Take a hot bath, exercise, or read a book.  Plan something enjoyable to do every day. Reward yourself for not smoking.  Explore interactive web-based programs that specialize in helping you quit. GET MEDICINE AND USE IT CORRECTLY Medicines can help you stop smoking and decrease the urge to smoke. Combining medicine with the above behavioral methods and support can greatly increase your chances of successfully quitting smoking.  Nicotine replacement therapy helps deliver nicotine to your body without the negative effects and risks of smoking. Nicotine replacement therapy includes nicotine gum, lozenges, inhalers, nasal sprays, and skin patches. Some may be available over-the-counter and others require a prescription.  Antidepressant medicine helps people abstain from smoking, but how this works is unknown. This medicine is available by prescription.  Nicotinic receptor partial agonist medicine simulates the effect of nicotine in your brain. This medicine is available by prescription. Ask your caregiver  for advice about which medicines to use and how to use them based on your health history. Your caregiver will tell you what side effects to look out for if you choose to be on a medicine or therapy. Carefully read the information on the package. Do not use any other product containing nicotine while using a nicotine replacement product.  RELAPSE OR DIFFICULT SITUATIONS Most relapses occur within the first 3 months after quitting. Do not be discouraged if you start smoking again. Remember, most people try several times before finally quitting. You may have symptoms of withdrawal because your body is used to nicotine. You may crave cigarettes, be irritable, feel very hungry, cough often, get headaches, or have difficulty concentrating. The withdrawal symptoms are only temporary. They are strongest when you first quit, but they will go away within 10 14 days. To reduce the chances of relapse, try to:  Avoid drinking alcohol. Drinking lowers your chances of successfully quitting.  Reduce the amount of caffeine you consume. Once you quit smoking, the amount of caffeine in your body increases and can give you symptoms, such as a rapid heartbeat, sweating, and anxiety.  Avoid smokers because they can make you want to smoke.  Do not let weight gain distract you. Many smokers will gain weight when they quit, usually less than 10 pounds. Eat a healthy diet and stay active. You can always lose the weight gained after you quit.  Find ways to improve your mood other than smoking. FOR MORE INFORMATION  www.smokefree.gov  Document Released: 09/16/2001 Document Revised: 03/23/2012 Document Reviewed: 01/01/2012 [facility] Patient Information 2014 [place], [place].

## 2014-01-20 ENCOUNTER — Other Ambulatory Visit: Payer: Self-pay | Admitting: Nurse Practitioner

## 2014-01-20 DIAGNOSIS — C3490 Malignant neoplasm of unspecified part of unspecified bronchus or lung: Secondary | ICD-10-CM

## 2014-01-20 LAB — ANEMIA PROFILE B
Basophils Absolute: 0 10*3/uL (ref 0.0–0.2)
Basos: 0 %
EOS ABS: 0.3 10*3/uL (ref 0.0–0.4)
Eos: 3 %
FERRITIN: 189 ng/mL (ref 30–400)
FOLATE: 14.1 ng/mL (ref >3.0)
HCT: 47.7 % (ref 37.5–51.0)
Hemoglobin: 16.1 g/dL (ref 12.6–17.7)
IMMATURE GRANS (ABS): 0 10*3/uL (ref 0.0–0.1)
IMMATURE GRANULOCYTES: 0 %
IRON: 74 ug/dL (ref 40–155)
Iron Saturation: 27 % (ref 15–55)
LYMPHS: 25 %
Lymphocytes Absolute: 2.4 10*3/uL (ref 0.7–3.1)
MCH: 31 pg (ref 26.6–33.0)
MCHC: 33.8 g/dL (ref 31.5–35.7)
MCV: 92 fL (ref 79–97)
MONOS ABS: 1 10*3/uL — AB (ref 0.1–0.9)
Monocytes: 11 %
NEUTROS PCT: 61 %
Neutrophils Absolute: 5.7 10*3/uL (ref 1.4–7.0)
PLATELETS: 220 10*3/uL (ref 150–379)
RBC: 5.2 x10E6/uL (ref 4.14–5.80)
RDW: 13.7 % (ref 12.3–15.4)
Retic Ct Pct: 1.5 % (ref 0.6–2.6)
TIBC: 272 ug/dL (ref 250–450)
UIBC: 198 ug/dL (ref 150–375)
VITAMIN B 12: 806 pg/mL (ref 211–946)
WBC: 9.4 10*3/uL (ref 3.4–10.8)

## 2014-01-20 LAB — NMR, LIPOPROFILE
Cholesterol: 149 mg/dL (ref <200)
HDL Cholesterol by NMR: 31 mg/dL — ABNORMAL LOW (ref >=40)
HDL Particle Number: 26.2 umol/L — ABNORMAL LOW (ref >=30.5)
LDL Particle Number: 1335 nmol/L — ABNORMAL HIGH (ref <1000)
LDL Size: 20 nm (ref >20.5)
LDLC SERPL CALC-MCNC: 81 mg/dL (ref <100)
LP-IR Score: 71 — ABNORMAL HIGH (ref <=45)
Small LDL Particle Number: 891 nmol/L — ABNORMAL HIGH (ref <=527)
Triglycerides by NMR: 186 mg/dL — ABNORMAL HIGH (ref <150)

## 2014-01-20 LAB — PSA, TOTAL AND FREE
PSA, Free Pct: >20 %
PSA, Free: 0.02 ng/mL
PSA: <0.1 ng/mL (ref 0.0–4.0)

## 2014-01-20 LAB — CMP14+EGFR
A/G RATIO: 2.1 (ref 1.1–2.5)
ALK PHOS: 48 IU/L (ref 39–117)
ALT: 31 IU/L (ref 0–44)
AST: 25 IU/L (ref 0–40)
Albumin: 4.4 g/dL (ref 3.6–4.8)
BILIRUBIN TOTAL: 0.4 mg/dL (ref 0.0–1.2)
BUN / CREAT RATIO: 12 (ref 10–22)
BUN: 26 mg/dL (ref 8–27)
CO2: 26 mmol/L (ref 18–29)
CREATININE: 2.17 mg/dL — AB (ref 0.76–1.27)
Calcium: 9.8 mg/dL (ref 8.6–10.2)
Chloride: 102 mmol/L (ref 97–108)
GFR calc non Af Amer: 30 mL/min/{1.73_m2} — ABNORMAL LOW (ref >59)
GFR, EST AFRICAN AMERICAN: 35 mL/min/{1.73_m2} — AB (ref >59)
GLOBULIN, TOTAL: 2.1 g/dL (ref 1.5–4.5)
Glucose: 91 mg/dL (ref 65–99)
Potassium: 5.1 mmol/L (ref 3.5–5.2)
Sodium: 145 mmol/L — ABNORMAL HIGH (ref 134–144)
Total Protein: 6.5 g/dL (ref 6.0–8.5)

## 2014-01-20 LAB — THYROID PANEL WITH TSH
Free Thyroxine Index: 2.4 (ref 1.2–4.9)
T3 Uptake Ratio: 29 % (ref 24–39)
T4, Total: 8.4 ug/dL (ref 4.5–12.0)
TSH: 1.88 u[IU]/mL (ref 0.450–4.500)

## 2014-01-23 NOTE — Progress Notes (Signed)
Aware. 

## 2014-01-24 ENCOUNTER — Encounter: Payer: Self-pay | Admitting: Family Medicine

## 2014-01-25 ENCOUNTER — Ambulatory Visit (HOSPITAL_COMMUNITY)
Admission: RE | Admit: 2014-01-25 | Discharge: 2014-01-25 | Disposition: A | Discharge status: Home / Self Care | Payer: Medicare Other | Source: Ambulatory Visit | Attending: Nurse Practitioner | Admitting: Nurse Practitioner

## 2014-01-25 ENCOUNTER — Encounter (HOSPITAL_COMMUNITY): Payer: Self-pay

## 2014-01-25 DIAGNOSIS — D3500 Benign neoplasm of unspecified adrenal gland: Secondary | ICD-10-CM | POA: Insufficient documentation

## 2014-01-25 DIAGNOSIS — I2584 Coronary atherosclerosis due to calcified coronary lesion: Secondary | ICD-10-CM | POA: Insufficient documentation

## 2014-01-25 DIAGNOSIS — R222 Localized swelling, mass and lump, trunk: Secondary | ICD-10-CM | POA: Diagnosis not present

## 2014-01-25 DIAGNOSIS — C3490 Malignant neoplasm of unspecified part of unspecified bronchus or lung: Secondary | ICD-10-CM | POA: Diagnosis not present

## 2014-01-25 LAB — GLUCOSE, CAPILLARY: GLUCOSE-CAPILLARY: 98 mg/dL (ref 70–99)

## 2014-01-25 MED ORDER — FLUDEOXYGLUCOSE F - 18 (FDG) INJECTION
12.8000 | Freq: Once | INTRAVENOUS | Status: AC | PRN
  Administered 2014-01-25: 12.8 via INTRAVENOUS

## 2014-01-27 DIAGNOSIS — C3490 Malignant neoplasm of unspecified part of unspecified bronchus or lung: Secondary | ICD-10-CM | POA: Diagnosis not present

## 2014-01-27 DIAGNOSIS — G311 Senile degeneration of brain, not elsewhere classified: Secondary | ICD-10-CM | POA: Diagnosis not present

## 2014-01-27 DIAGNOSIS — R911 Solitary pulmonary nodule: Secondary | ICD-10-CM | POA: Diagnosis not present

## 2014-01-27 DIAGNOSIS — R222 Localized swelling, mass and lump, trunk: Secondary | ICD-10-CM | POA: Diagnosis not present

## 2014-01-27 DIAGNOSIS — R279 Unspecified lack of coordination: Secondary | ICD-10-CM | POA: Diagnosis not present

## 2014-01-27 DIAGNOSIS — Z0389 Encounter for observation for other suspected diseases and conditions ruled out: Secondary | ICD-10-CM | POA: Diagnosis not present

## 2014-02-03 ENCOUNTER — Encounter: Payer: Self-pay | Admitting: *Deleted

## 2014-02-07 DIAGNOSIS — F17200 Nicotine dependence, unspecified, uncomplicated: Secondary | ICD-10-CM | POA: Diagnosis not present

## 2014-02-07 DIAGNOSIS — C771 Secondary and unspecified malignant neoplasm of intrathoracic lymph nodes: Secondary | ICD-10-CM | POA: Diagnosis not present

## 2014-02-07 DIAGNOSIS — K219 Gastro-esophageal reflux disease without esophagitis: Secondary | ICD-10-CM | POA: Diagnosis not present

## 2014-02-07 DIAGNOSIS — R222 Localized swelling, mass and lump, trunk: Secondary | ICD-10-CM | POA: Diagnosis not present

## 2014-02-07 DIAGNOSIS — R599 Enlarged lymph nodes, unspecified: Secondary | ICD-10-CM | POA: Diagnosis not present

## 2014-02-07 DIAGNOSIS — E785 Hyperlipidemia, unspecified: Secondary | ICD-10-CM | POA: Diagnosis not present

## 2014-02-07 DIAGNOSIS — D360 Benign neoplasm of lymph nodes: Secondary | ICD-10-CM | POA: Diagnosis not present

## 2014-02-07 DIAGNOSIS — R911 Solitary pulmonary nodule: Secondary | ICD-10-CM | POA: Diagnosis not present

## 2014-02-14 DIAGNOSIS — C3490 Malignant neoplasm of unspecified part of unspecified bronchus or lung: Secondary | ICD-10-CM | POA: Diagnosis not present

## 2014-02-17 DIAGNOSIS — Z510 Encounter for antineoplastic radiation therapy: Secondary | ICD-10-CM | POA: Diagnosis not present

## 2014-02-17 DIAGNOSIS — C3490 Malignant neoplasm of unspecified part of unspecified bronchus or lung: Secondary | ICD-10-CM | POA: Diagnosis not present

## 2014-02-17 DIAGNOSIS — C781 Secondary malignant neoplasm of mediastinum: Secondary | ICD-10-CM | POA: Diagnosis not present

## 2014-02-17 DIAGNOSIS — C3410 Malignant neoplasm of upper lobe, unspecified bronchus or lung: Secondary | ICD-10-CM | POA: Diagnosis not present

## 2014-02-17 DIAGNOSIS — C3411 Malignant neoplasm of upper lobe, right bronchus or lung: Secondary | ICD-10-CM | POA: Insufficient documentation

## 2014-02-22 DIAGNOSIS — R599 Enlarged lymph nodes, unspecified: Secondary | ICD-10-CM | POA: Diagnosis not present

## 2014-02-22 DIAGNOSIS — J449 Chronic obstructive pulmonary disease, unspecified: Secondary | ICD-10-CM | POA: Diagnosis not present

## 2014-02-22 DIAGNOSIS — C771 Secondary and unspecified malignant neoplasm of intrathoracic lymph nodes: Secondary | ICD-10-CM | POA: Diagnosis not present

## 2014-02-22 DIAGNOSIS — C801 Malignant (primary) neoplasm, unspecified: Secondary | ICD-10-CM | POA: Diagnosis not present

## 2014-02-22 DIAGNOSIS — C3410 Malignant neoplasm of upper lobe, unspecified bronchus or lung: Secondary | ICD-10-CM | POA: Diagnosis not present

## 2014-02-22 DIAGNOSIS — R948 Abnormal results of function studies of other organs and systems: Secondary | ICD-10-CM | POA: Diagnosis not present

## 2014-02-22 DIAGNOSIS — R933 Abnormal findings on diagnostic imaging of other parts of digestive tract: Secondary | ICD-10-CM | POA: Diagnosis not present

## 2014-02-23 ENCOUNTER — Other Ambulatory Visit: Payer: Self-pay | Admitting: *Deleted

## 2014-02-23 DIAGNOSIS — C3410 Malignant neoplasm of upper lobe, unspecified bronchus or lung: Secondary | ICD-10-CM | POA: Diagnosis not present

## 2014-02-23 DIAGNOSIS — C3490 Malignant neoplasm of unspecified part of unspecified bronchus or lung: Secondary | ICD-10-CM | POA: Diagnosis not present

## 2014-02-23 DIAGNOSIS — C781 Secondary malignant neoplasm of mediastinum: Secondary | ICD-10-CM | POA: Diagnosis not present

## 2014-02-23 DIAGNOSIS — L570 Actinic keratosis: Secondary | ICD-10-CM | POA: Diagnosis not present

## 2014-02-23 DIAGNOSIS — L820 Inflamed seborrheic keratosis: Secondary | ICD-10-CM | POA: Diagnosis not present

## 2014-02-23 DIAGNOSIS — Z510 Encounter for antineoplastic radiation therapy: Secondary | ICD-10-CM | POA: Diagnosis not present

## 2014-02-23 DIAGNOSIS — L259 Unspecified contact dermatitis, unspecified cause: Secondary | ICD-10-CM | POA: Diagnosis not present

## 2014-02-23 MED ORDER — FENOFIBRATE 145 MG PO TABS: 145.0000 mg | ORAL_TABLET | Freq: Every day | ORAL | Status: DC

## 2014-03-02 DIAGNOSIS — C3490 Malignant neoplasm of unspecified part of unspecified bronchus or lung: Secondary | ICD-10-CM | POA: Diagnosis not present

## 2014-03-02 DIAGNOSIS — C3410 Malignant neoplasm of upper lobe, unspecified bronchus or lung: Secondary | ICD-10-CM | POA: Diagnosis not present

## 2014-03-02 DIAGNOSIS — Z510 Encounter for antineoplastic radiation therapy: Secondary | ICD-10-CM | POA: Diagnosis not present

## 2014-03-02 DIAGNOSIS — C781 Secondary malignant neoplasm of mediastinum: Secondary | ICD-10-CM | POA: Diagnosis not present

## 2014-03-02 DIAGNOSIS — Z5111 Encounter for antineoplastic chemotherapy: Secondary | ICD-10-CM | POA: Diagnosis not present

## 2014-03-03 DIAGNOSIS — Z510 Encounter for antineoplastic radiation therapy: Secondary | ICD-10-CM | POA: Diagnosis not present

## 2014-03-03 DIAGNOSIS — C3490 Malignant neoplasm of unspecified part of unspecified bronchus or lung: Secondary | ICD-10-CM | POA: Diagnosis not present

## 2014-03-03 DIAGNOSIS — C3410 Malignant neoplasm of upper lobe, unspecified bronchus or lung: Secondary | ICD-10-CM | POA: Diagnosis not present

## 2014-03-03 DIAGNOSIS — C781 Secondary malignant neoplasm of mediastinum: Secondary | ICD-10-CM | POA: Diagnosis not present

## 2014-03-06 DIAGNOSIS — Z510 Encounter for antineoplastic radiation therapy: Secondary | ICD-10-CM | POA: Diagnosis not present

## 2014-03-06 DIAGNOSIS — C3410 Malignant neoplasm of upper lobe, unspecified bronchus or lung: Secondary | ICD-10-CM | POA: Diagnosis not present

## 2014-03-06 DIAGNOSIS — C3490 Malignant neoplasm of unspecified part of unspecified bronchus or lung: Secondary | ICD-10-CM | POA: Diagnosis not present

## 2014-03-06 DIAGNOSIS — C781 Secondary malignant neoplasm of mediastinum: Secondary | ICD-10-CM | POA: Diagnosis not present

## 2014-03-07 DIAGNOSIS — C3410 Malignant neoplasm of upper lobe, unspecified bronchus or lung: Secondary | ICD-10-CM | POA: Diagnosis not present

## 2014-03-07 DIAGNOSIS — C781 Secondary malignant neoplasm of mediastinum: Secondary | ICD-10-CM | POA: Diagnosis not present

## 2014-03-07 DIAGNOSIS — C3490 Malignant neoplasm of unspecified part of unspecified bronchus or lung: Secondary | ICD-10-CM | POA: Diagnosis not present

## 2014-03-07 DIAGNOSIS — Z510 Encounter for antineoplastic radiation therapy: Secondary | ICD-10-CM | POA: Diagnosis not present

## 2014-03-08 DIAGNOSIS — C3410 Malignant neoplasm of upper lobe, unspecified bronchus or lung: Secondary | ICD-10-CM | POA: Diagnosis not present

## 2014-03-08 DIAGNOSIS — C3490 Malignant neoplasm of unspecified part of unspecified bronchus or lung: Secondary | ICD-10-CM | POA: Diagnosis not present

## 2014-03-08 DIAGNOSIS — Z510 Encounter for antineoplastic radiation therapy: Secondary | ICD-10-CM | POA: Diagnosis not present

## 2014-03-08 DIAGNOSIS — C781 Secondary malignant neoplasm of mediastinum: Secondary | ICD-10-CM | POA: Diagnosis not present

## 2014-03-09 DIAGNOSIS — F17200 Nicotine dependence, unspecified, uncomplicated: Secondary | ICD-10-CM | POA: Diagnosis not present

## 2014-03-09 DIAGNOSIS — C781 Secondary malignant neoplasm of mediastinum: Secondary | ICD-10-CM | POA: Diagnosis not present

## 2014-03-09 DIAGNOSIS — C3410 Malignant neoplasm of upper lobe, unspecified bronchus or lung: Secondary | ICD-10-CM | POA: Diagnosis not present

## 2014-03-09 DIAGNOSIS — Z5111 Encounter for antineoplastic chemotherapy: Secondary | ICD-10-CM | POA: Diagnosis not present

## 2014-03-09 DIAGNOSIS — Z510 Encounter for antineoplastic radiation therapy: Secondary | ICD-10-CM | POA: Diagnosis not present

## 2014-03-09 DIAGNOSIS — C3490 Malignant neoplasm of unspecified part of unspecified bronchus or lung: Secondary | ICD-10-CM | POA: Diagnosis not present

## 2014-03-10 DIAGNOSIS — Z510 Encounter for antineoplastic radiation therapy: Secondary | ICD-10-CM | POA: Diagnosis not present

## 2014-03-10 DIAGNOSIS — C3490 Malignant neoplasm of unspecified part of unspecified bronchus or lung: Secondary | ICD-10-CM | POA: Diagnosis not present

## 2014-03-10 DIAGNOSIS — C3410 Malignant neoplasm of upper lobe, unspecified bronchus or lung: Secondary | ICD-10-CM | POA: Diagnosis not present

## 2014-03-10 DIAGNOSIS — C781 Secondary malignant neoplasm of mediastinum: Secondary | ICD-10-CM | POA: Diagnosis not present

## 2014-03-13 DIAGNOSIS — C3490 Malignant neoplasm of unspecified part of unspecified bronchus or lung: Secondary | ICD-10-CM | POA: Diagnosis not present

## 2014-03-13 DIAGNOSIS — C781 Secondary malignant neoplasm of mediastinum: Secondary | ICD-10-CM | POA: Diagnosis not present

## 2014-03-13 DIAGNOSIS — C3410 Malignant neoplasm of upper lobe, unspecified bronchus or lung: Secondary | ICD-10-CM | POA: Diagnosis not present

## 2014-03-13 DIAGNOSIS — Z510 Encounter for antineoplastic radiation therapy: Secondary | ICD-10-CM | POA: Diagnosis not present

## 2014-03-14 DIAGNOSIS — C3490 Malignant neoplasm of unspecified part of unspecified bronchus or lung: Secondary | ICD-10-CM | POA: Diagnosis not present

## 2014-03-14 DIAGNOSIS — C781 Secondary malignant neoplasm of mediastinum: Secondary | ICD-10-CM | POA: Diagnosis not present

## 2014-03-14 DIAGNOSIS — Z510 Encounter for antineoplastic radiation therapy: Secondary | ICD-10-CM | POA: Diagnosis not present

## 2014-03-14 DIAGNOSIS — C3410 Malignant neoplasm of upper lobe, unspecified bronchus or lung: Secondary | ICD-10-CM | POA: Diagnosis not present

## 2014-03-15 DIAGNOSIS — C3490 Malignant neoplasm of unspecified part of unspecified bronchus or lung: Secondary | ICD-10-CM | POA: Diagnosis not present

## 2014-03-15 DIAGNOSIS — Z510 Encounter for antineoplastic radiation therapy: Secondary | ICD-10-CM | POA: Diagnosis not present

## 2014-03-15 DIAGNOSIS — C781 Secondary malignant neoplasm of mediastinum: Secondary | ICD-10-CM | POA: Diagnosis not present

## 2014-03-15 DIAGNOSIS — C3410 Malignant neoplasm of upper lobe, unspecified bronchus or lung: Secondary | ICD-10-CM | POA: Diagnosis not present

## 2014-03-16 DIAGNOSIS — Z5111 Encounter for antineoplastic chemotherapy: Secondary | ICD-10-CM | POA: Diagnosis not present

## 2014-03-16 DIAGNOSIS — F17200 Nicotine dependence, unspecified, uncomplicated: Secondary | ICD-10-CM | POA: Diagnosis not present

## 2014-03-16 DIAGNOSIS — Z510 Encounter for antineoplastic radiation therapy: Secondary | ICD-10-CM | POA: Diagnosis not present

## 2014-03-16 DIAGNOSIS — C3490 Malignant neoplasm of unspecified part of unspecified bronchus or lung: Secondary | ICD-10-CM | POA: Diagnosis not present

## 2014-03-16 DIAGNOSIS — C781 Secondary malignant neoplasm of mediastinum: Secondary | ICD-10-CM | POA: Diagnosis not present

## 2014-03-16 DIAGNOSIS — C3410 Malignant neoplasm of upper lobe, unspecified bronchus or lung: Secondary | ICD-10-CM | POA: Diagnosis not present

## 2014-03-17 DIAGNOSIS — Z510 Encounter for antineoplastic radiation therapy: Secondary | ICD-10-CM | POA: Diagnosis not present

## 2014-03-17 DIAGNOSIS — C3410 Malignant neoplasm of upper lobe, unspecified bronchus or lung: Secondary | ICD-10-CM | POA: Diagnosis not present

## 2014-03-17 DIAGNOSIS — C781 Secondary malignant neoplasm of mediastinum: Secondary | ICD-10-CM | POA: Diagnosis not present

## 2014-03-17 DIAGNOSIS — C3490 Malignant neoplasm of unspecified part of unspecified bronchus or lung: Secondary | ICD-10-CM | POA: Diagnosis not present

## 2014-03-20 DIAGNOSIS — C781 Secondary malignant neoplasm of mediastinum: Secondary | ICD-10-CM | POA: Diagnosis not present

## 2014-03-20 DIAGNOSIS — Z510 Encounter for antineoplastic radiation therapy: Secondary | ICD-10-CM | POA: Diagnosis not present

## 2014-03-20 DIAGNOSIS — C3410 Malignant neoplasm of upper lobe, unspecified bronchus or lung: Secondary | ICD-10-CM | POA: Diagnosis not present

## 2014-03-20 DIAGNOSIS — C3490 Malignant neoplasm of unspecified part of unspecified bronchus or lung: Secondary | ICD-10-CM | POA: Diagnosis not present

## 2014-03-23 DIAGNOSIS — C3490 Malignant neoplasm of unspecified part of unspecified bronchus or lung: Secondary | ICD-10-CM | POA: Diagnosis not present

## 2014-03-23 DIAGNOSIS — Z5111 Encounter for antineoplastic chemotherapy: Secondary | ICD-10-CM | POA: Diagnosis not present

## 2014-03-27 DIAGNOSIS — C781 Secondary malignant neoplasm of mediastinum: Secondary | ICD-10-CM | POA: Diagnosis not present

## 2014-03-27 DIAGNOSIS — Z510 Encounter for antineoplastic radiation therapy: Secondary | ICD-10-CM | POA: Diagnosis not present

## 2014-03-27 DIAGNOSIS — C3490 Malignant neoplasm of unspecified part of unspecified bronchus or lung: Secondary | ICD-10-CM | POA: Diagnosis not present

## 2014-03-27 DIAGNOSIS — C3410 Malignant neoplasm of upper lobe, unspecified bronchus or lung: Secondary | ICD-10-CM | POA: Diagnosis not present

## 2014-03-28 DIAGNOSIS — R5381 Other malaise: Secondary | ICD-10-CM | POA: Diagnosis not present

## 2014-03-28 DIAGNOSIS — Z510 Encounter for antineoplastic radiation therapy: Secondary | ICD-10-CM | POA: Diagnosis not present

## 2014-03-28 DIAGNOSIS — E860 Dehydration: Secondary | ICD-10-CM | POA: Diagnosis not present

## 2014-03-28 DIAGNOSIS — R5383 Other fatigue: Secondary | ICD-10-CM | POA: Diagnosis not present

## 2014-03-28 DIAGNOSIS — C781 Secondary malignant neoplasm of mediastinum: Secondary | ICD-10-CM | POA: Diagnosis not present

## 2014-03-28 DIAGNOSIS — C3410 Malignant neoplasm of upper lobe, unspecified bronchus or lung: Secondary | ICD-10-CM | POA: Diagnosis not present

## 2014-03-28 DIAGNOSIS — C3490 Malignant neoplasm of unspecified part of unspecified bronchus or lung: Secondary | ICD-10-CM | POA: Diagnosis not present

## 2014-03-29 DIAGNOSIS — Z510 Encounter for antineoplastic radiation therapy: Secondary | ICD-10-CM | POA: Diagnosis not present

## 2014-03-29 DIAGNOSIS — C3410 Malignant neoplasm of upper lobe, unspecified bronchus or lung: Secondary | ICD-10-CM | POA: Diagnosis not present

## 2014-03-29 DIAGNOSIS — R638 Other symptoms and signs concerning food and fluid intake: Secondary | ICD-10-CM | POA: Diagnosis not present

## 2014-03-29 DIAGNOSIS — C781 Secondary malignant neoplasm of mediastinum: Secondary | ICD-10-CM | POA: Diagnosis not present

## 2014-03-29 DIAGNOSIS — N289 Disorder of kidney and ureter, unspecified: Secondary | ICD-10-CM | POA: Diagnosis not present

## 2014-03-29 DIAGNOSIS — C3490 Malignant neoplasm of unspecified part of unspecified bronchus or lung: Secondary | ICD-10-CM | POA: Diagnosis not present

## 2014-03-30 DIAGNOSIS — C3490 Malignant neoplasm of unspecified part of unspecified bronchus or lung: Secondary | ICD-10-CM | POA: Diagnosis not present

## 2014-03-30 DIAGNOSIS — Z510 Encounter for antineoplastic radiation therapy: Secondary | ICD-10-CM | POA: Diagnosis not present

## 2014-03-30 DIAGNOSIS — F17200 Nicotine dependence, unspecified, uncomplicated: Secondary | ICD-10-CM | POA: Diagnosis not present

## 2014-03-30 DIAGNOSIS — C3410 Malignant neoplasm of upper lobe, unspecified bronchus or lung: Secondary | ICD-10-CM | POA: Diagnosis not present

## 2014-03-30 DIAGNOSIS — Z5111 Encounter for antineoplastic chemotherapy: Secondary | ICD-10-CM | POA: Diagnosis not present

## 2014-03-30 DIAGNOSIS — C781 Secondary malignant neoplasm of mediastinum: Secondary | ICD-10-CM | POA: Diagnosis not present

## 2014-03-31 DIAGNOSIS — Z510 Encounter for antineoplastic radiation therapy: Secondary | ICD-10-CM | POA: Diagnosis not present

## 2014-03-31 DIAGNOSIS — C3490 Malignant neoplasm of unspecified part of unspecified bronchus or lung: Secondary | ICD-10-CM | POA: Diagnosis not present

## 2014-04-03 DIAGNOSIS — C3410 Malignant neoplasm of upper lobe, unspecified bronchus or lung: Secondary | ICD-10-CM | POA: Diagnosis not present

## 2014-04-03 DIAGNOSIS — Z510 Encounter for antineoplastic radiation therapy: Secondary | ICD-10-CM | POA: Diagnosis not present

## 2014-04-03 DIAGNOSIS — N289 Disorder of kidney and ureter, unspecified: Secondary | ICD-10-CM | POA: Diagnosis not present

## 2014-04-03 DIAGNOSIS — C781 Secondary malignant neoplasm of mediastinum: Secondary | ICD-10-CM | POA: Diagnosis not present

## 2014-04-03 DIAGNOSIS — R638 Other symptoms and signs concerning food and fluid intake: Secondary | ICD-10-CM | POA: Diagnosis not present

## 2014-04-03 DIAGNOSIS — C3490 Malignant neoplasm of unspecified part of unspecified bronchus or lung: Secondary | ICD-10-CM | POA: Diagnosis not present

## 2014-04-04 DIAGNOSIS — Z510 Encounter for antineoplastic radiation therapy: Secondary | ICD-10-CM | POA: Diagnosis not present

## 2014-04-04 DIAGNOSIS — C3490 Malignant neoplasm of unspecified part of unspecified bronchus or lung: Secondary | ICD-10-CM | POA: Diagnosis not present

## 2014-04-04 DIAGNOSIS — C781 Secondary malignant neoplasm of mediastinum: Secondary | ICD-10-CM | POA: Diagnosis not present

## 2014-04-04 DIAGNOSIS — C3410 Malignant neoplasm of upper lobe, unspecified bronchus or lung: Secondary | ICD-10-CM | POA: Diagnosis not present

## 2014-04-05 DIAGNOSIS — Z510 Encounter for antineoplastic radiation therapy: Secondary | ICD-10-CM | POA: Diagnosis not present

## 2014-04-05 DIAGNOSIS — C3410 Malignant neoplasm of upper lobe, unspecified bronchus or lung: Secondary | ICD-10-CM | POA: Diagnosis not present

## 2014-04-05 DIAGNOSIS — C3490 Malignant neoplasm of unspecified part of unspecified bronchus or lung: Secondary | ICD-10-CM | POA: Diagnosis not present

## 2014-04-05 DIAGNOSIS — C781 Secondary malignant neoplasm of mediastinum: Secondary | ICD-10-CM | POA: Diagnosis not present

## 2014-04-06 DIAGNOSIS — Z510 Encounter for antineoplastic radiation therapy: Secondary | ICD-10-CM | POA: Diagnosis not present

## 2014-04-06 DIAGNOSIS — C781 Secondary malignant neoplasm of mediastinum: Secondary | ICD-10-CM | POA: Diagnosis not present

## 2014-04-06 DIAGNOSIS — C3490 Malignant neoplasm of unspecified part of unspecified bronchus or lung: Secondary | ICD-10-CM | POA: Diagnosis not present

## 2014-04-06 DIAGNOSIS — Z5111 Encounter for antineoplastic chemotherapy: Secondary | ICD-10-CM | POA: Diagnosis not present

## 2014-04-06 DIAGNOSIS — C3410 Malignant neoplasm of upper lobe, unspecified bronchus or lung: Secondary | ICD-10-CM | POA: Diagnosis not present

## 2014-04-10 DIAGNOSIS — Z510 Encounter for antineoplastic radiation therapy: Secondary | ICD-10-CM | POA: Diagnosis not present

## 2014-04-10 DIAGNOSIS — C3490 Malignant neoplasm of unspecified part of unspecified bronchus or lung: Secondary | ICD-10-CM | POA: Diagnosis not present

## 2014-04-10 DIAGNOSIS — C3410 Malignant neoplasm of upper lobe, unspecified bronchus or lung: Secondary | ICD-10-CM | POA: Diagnosis not present

## 2014-04-10 DIAGNOSIS — C781 Secondary malignant neoplasm of mediastinum: Secondary | ICD-10-CM | POA: Diagnosis not present

## 2014-04-11 DIAGNOSIS — K208 Other esophagitis without bleeding: Secondary | ICD-10-CM | POA: Diagnosis not present

## 2014-04-11 DIAGNOSIS — C781 Secondary malignant neoplasm of mediastinum: Secondary | ICD-10-CM | POA: Diagnosis not present

## 2014-04-11 DIAGNOSIS — K921 Melena: Secondary | ICD-10-CM | POA: Diagnosis not present

## 2014-04-11 DIAGNOSIS — Z79899 Other long term (current) drug therapy: Secondary | ICD-10-CM | POA: Diagnosis not present

## 2014-04-11 DIAGNOSIS — I10 Essential (primary) hypertension: Secondary | ICD-10-CM | POA: Diagnosis not present

## 2014-04-11 DIAGNOSIS — Z510 Encounter for antineoplastic radiation therapy: Secondary | ICD-10-CM | POA: Diagnosis not present

## 2014-04-11 DIAGNOSIS — C3410 Malignant neoplasm of upper lobe, unspecified bronchus or lung: Secondary | ICD-10-CM | POA: Diagnosis not present

## 2014-04-11 DIAGNOSIS — C3490 Malignant neoplasm of unspecified part of unspecified bronchus or lung: Secondary | ICD-10-CM | POA: Diagnosis not present

## 2014-04-12 DIAGNOSIS — C3410 Malignant neoplasm of upper lobe, unspecified bronchus or lung: Secondary | ICD-10-CM | POA: Diagnosis not present

## 2014-04-12 DIAGNOSIS — C3490 Malignant neoplasm of unspecified part of unspecified bronchus or lung: Secondary | ICD-10-CM | POA: Diagnosis not present

## 2014-04-12 DIAGNOSIS — C781 Secondary malignant neoplasm of mediastinum: Secondary | ICD-10-CM | POA: Diagnosis not present

## 2014-04-12 DIAGNOSIS — Z510 Encounter for antineoplastic radiation therapy: Secondary | ICD-10-CM | POA: Diagnosis not present

## 2014-04-13 DIAGNOSIS — C781 Secondary malignant neoplasm of mediastinum: Secondary | ICD-10-CM | POA: Diagnosis not present

## 2014-04-13 DIAGNOSIS — C3490 Malignant neoplasm of unspecified part of unspecified bronchus or lung: Secondary | ICD-10-CM | POA: Diagnosis not present

## 2014-04-13 DIAGNOSIS — C3410 Malignant neoplasm of upper lobe, unspecified bronchus or lung: Secondary | ICD-10-CM | POA: Diagnosis not present

## 2014-04-13 DIAGNOSIS — Z510 Encounter for antineoplastic radiation therapy: Secondary | ICD-10-CM | POA: Diagnosis not present

## 2014-04-14 DIAGNOSIS — F17200 Nicotine dependence, unspecified, uncomplicated: Secondary | ICD-10-CM | POA: Diagnosis not present

## 2014-04-14 DIAGNOSIS — C3410 Malignant neoplasm of upper lobe, unspecified bronchus or lung: Secondary | ICD-10-CM | POA: Diagnosis not present

## 2014-04-14 DIAGNOSIS — Z510 Encounter for antineoplastic radiation therapy: Secondary | ICD-10-CM | POA: Diagnosis not present

## 2014-04-14 DIAGNOSIS — C3490 Malignant neoplasm of unspecified part of unspecified bronchus or lung: Secondary | ICD-10-CM | POA: Diagnosis not present

## 2014-04-14 DIAGNOSIS — Z5111 Encounter for antineoplastic chemotherapy: Secondary | ICD-10-CM | POA: Diagnosis not present

## 2014-04-14 DIAGNOSIS — C781 Secondary malignant neoplasm of mediastinum: Secondary | ICD-10-CM | POA: Diagnosis not present

## 2014-04-14 DIAGNOSIS — R633 Feeding difficulties, unspecified: Secondary | ICD-10-CM | POA: Diagnosis not present

## 2014-04-14 DIAGNOSIS — N289 Disorder of kidney and ureter, unspecified: Secondary | ICD-10-CM | POA: Diagnosis not present

## 2014-04-17 DIAGNOSIS — C3410 Malignant neoplasm of upper lobe, unspecified bronchus or lung: Secondary | ICD-10-CM | POA: Diagnosis not present

## 2014-04-17 DIAGNOSIS — C781 Secondary malignant neoplasm of mediastinum: Secondary | ICD-10-CM | POA: Diagnosis not present

## 2014-04-17 DIAGNOSIS — C3490 Malignant neoplasm of unspecified part of unspecified bronchus or lung: Secondary | ICD-10-CM | POA: Diagnosis not present

## 2014-04-17 DIAGNOSIS — Z510 Encounter for antineoplastic radiation therapy: Secondary | ICD-10-CM | POA: Diagnosis not present

## 2014-04-18 DIAGNOSIS — C3410 Malignant neoplasm of upper lobe, unspecified bronchus or lung: Secondary | ICD-10-CM | POA: Diagnosis not present

## 2014-04-18 DIAGNOSIS — Z510 Encounter for antineoplastic radiation therapy: Secondary | ICD-10-CM | POA: Diagnosis not present

## 2014-04-18 DIAGNOSIS — C3490 Malignant neoplasm of unspecified part of unspecified bronchus or lung: Secondary | ICD-10-CM | POA: Diagnosis not present

## 2014-04-18 DIAGNOSIS — C781 Secondary malignant neoplasm of mediastinum: Secondary | ICD-10-CM | POA: Diagnosis not present

## 2014-04-19 DIAGNOSIS — C781 Secondary malignant neoplasm of mediastinum: Secondary | ICD-10-CM | POA: Diagnosis not present

## 2014-04-19 DIAGNOSIS — C3490 Malignant neoplasm of unspecified part of unspecified bronchus or lung: Secondary | ICD-10-CM | POA: Diagnosis not present

## 2014-04-19 DIAGNOSIS — C3410 Malignant neoplasm of upper lobe, unspecified bronchus or lung: Secondary | ICD-10-CM | POA: Diagnosis not present

## 2014-04-19 DIAGNOSIS — M7989 Other specified soft tissue disorders: Secondary | ICD-10-CM | POA: Diagnosis not present

## 2014-04-19 DIAGNOSIS — Z510 Encounter for antineoplastic radiation therapy: Secondary | ICD-10-CM | POA: Diagnosis not present

## 2014-04-20 DIAGNOSIS — R222 Localized swelling, mass and lump, trunk: Secondary | ICD-10-CM | POA: Diagnosis not present

## 2014-04-20 DIAGNOSIS — R638 Other symptoms and signs concerning food and fluid intake: Secondary | ICD-10-CM | POA: Diagnosis not present

## 2014-04-21 ENCOUNTER — Ambulatory Visit: Payer: Medicare Other | Admitting: Nurse Practitioner

## 2014-04-25 DIAGNOSIS — C3490 Malignant neoplasm of unspecified part of unspecified bronchus or lung: Secondary | ICD-10-CM | POA: Diagnosis not present

## 2014-05-23 DIAGNOSIS — R599 Enlarged lymph nodes, unspecified: Secondary | ICD-10-CM | POA: Diagnosis not present

## 2014-05-23 DIAGNOSIS — Z85820 Personal history of malignant melanoma of skin: Secondary | ICD-10-CM | POA: Diagnosis not present

## 2014-05-23 DIAGNOSIS — R918 Other nonspecific abnormal finding of lung field: Secondary | ICD-10-CM | POA: Diagnosis not present

## 2014-05-23 DIAGNOSIS — Z8551 Personal history of malignant neoplasm of bladder: Secondary | ICD-10-CM | POA: Diagnosis not present

## 2014-05-23 DIAGNOSIS — Z79899 Other long term (current) drug therapy: Secondary | ICD-10-CM | POA: Diagnosis not present

## 2014-05-23 DIAGNOSIS — K209 Esophagitis, unspecified without bleeding: Secondary | ICD-10-CM | POA: Diagnosis not present

## 2014-05-23 DIAGNOSIS — C3490 Malignant neoplasm of unspecified part of unspecified bronchus or lung: Secondary | ICD-10-CM | POA: Diagnosis not present

## 2014-05-23 DIAGNOSIS — R0602 Shortness of breath: Secondary | ICD-10-CM | POA: Diagnosis not present

## 2014-05-23 DIAGNOSIS — Z8546 Personal history of malignant neoplasm of prostate: Secondary | ICD-10-CM | POA: Diagnosis not present

## 2014-07-26 ENCOUNTER — Other Ambulatory Visit: Payer: Self-pay | Admitting: *Deleted

## 2014-08-02 ENCOUNTER — Ambulatory Visit (INDEPENDENT_AMBULATORY_CARE_PROVIDER_SITE_OTHER): Payer: Medicare Other

## 2014-08-02 DIAGNOSIS — Z23 Encounter for immunization: Secondary | ICD-10-CM

## 2014-08-10 ENCOUNTER — Other Ambulatory Visit: Payer: Self-pay | Admitting: Nurse Practitioner

## 2014-08-11 DIAGNOSIS — H578 Other specified disorders of eye and adnexa: Secondary | ICD-10-CM | POA: Diagnosis not present

## 2014-08-11 DIAGNOSIS — S00251A Superficial foreign body of right eyelid and periocular area, initial encounter: Secondary | ICD-10-CM | POA: Diagnosis not present

## 2014-08-22 DIAGNOSIS — K210 Gastro-esophageal reflux disease with esophagitis: Secondary | ICD-10-CM | POA: Diagnosis not present

## 2014-08-22 DIAGNOSIS — Z85820 Personal history of malignant melanoma of skin: Secondary | ICD-10-CM | POA: Diagnosis not present

## 2014-08-22 DIAGNOSIS — Z8589 Personal history of malignant neoplasm of other organs and systems: Secondary | ICD-10-CM | POA: Diagnosis not present

## 2014-08-22 DIAGNOSIS — E785 Hyperlipidemia, unspecified: Secondary | ICD-10-CM | POA: Diagnosis not present

## 2014-08-22 DIAGNOSIS — C3411 Malignant neoplasm of upper lobe, right bronchus or lung: Secondary | ICD-10-CM | POA: Diagnosis not present

## 2014-08-22 DIAGNOSIS — Z800 Family history of malignant neoplasm of digestive organs: Secondary | ICD-10-CM | POA: Diagnosis not present

## 2014-08-22 DIAGNOSIS — Z9079 Acquired absence of other genital organ(s): Secondary | ICD-10-CM | POA: Diagnosis not present

## 2014-08-22 DIAGNOSIS — Z932 Ileostomy status: Secondary | ICD-10-CM | POA: Diagnosis not present

## 2014-08-22 DIAGNOSIS — R0602 Shortness of breath: Secondary | ICD-10-CM | POA: Diagnosis not present

## 2014-08-22 DIAGNOSIS — C3491 Malignant neoplasm of unspecified part of right bronchus or lung: Secondary | ICD-10-CM | POA: Diagnosis not present

## 2014-08-22 DIAGNOSIS — K209 Esophagitis, unspecified: Secondary | ICD-10-CM | POA: Diagnosis not present

## 2014-08-22 DIAGNOSIS — J701 Chronic and other pulmonary manifestations due to radiation: Secondary | ICD-10-CM | POA: Diagnosis not present

## 2014-08-22 DIAGNOSIS — Z8051 Family history of malignant neoplasm of kidney: Secondary | ICD-10-CM | POA: Diagnosis not present

## 2014-08-22 DIAGNOSIS — Z85828 Personal history of other malignant neoplasm of skin: Secondary | ICD-10-CM | POA: Diagnosis not present

## 2014-08-22 DIAGNOSIS — C3490 Malignant neoplasm of unspecified part of unspecified bronchus or lung: Secondary | ICD-10-CM | POA: Diagnosis not present

## 2014-08-22 DIAGNOSIS — C61 Malignant neoplasm of prostate: Secondary | ICD-10-CM | POA: Diagnosis not present

## 2014-08-22 DIAGNOSIS — C679 Malignant neoplasm of bladder, unspecified: Secondary | ICD-10-CM | POA: Diagnosis not present

## 2014-08-22 DIAGNOSIS — Z85118 Personal history of other malignant neoplasm of bronchus and lung: Secondary | ICD-10-CM | POA: Diagnosis not present

## 2014-08-22 DIAGNOSIS — K759 Inflammatory liver disease, unspecified: Secondary | ICD-10-CM | POA: Diagnosis not present

## 2014-08-22 DIAGNOSIS — Z720 Tobacco use: Secondary | ICD-10-CM | POA: Diagnosis not present

## 2014-08-23 ENCOUNTER — Ambulatory Visit: Payer: Medicare Other

## 2014-10-08 ENCOUNTER — Other Ambulatory Visit: Payer: Self-pay | Admitting: Nurse Practitioner

## 2014-10-08 MED ORDER — LISINOPRIL-HYDROCHLOROTHIAZIDE 20-12.5 MG PO TABS: 1.0000 | ORAL_TABLET | Freq: Every day | ORAL | Status: DC

## 2014-10-17 ENCOUNTER — Other Ambulatory Visit: Payer: Self-pay | Admitting: Nurse Practitioner

## 2014-10-17 NOTE — Telephone Encounter (Signed)
Refill once. NTBS. Last seen 4/15

## 2014-11-21 DIAGNOSIS — Z8546 Personal history of malignant neoplasm of prostate: Secondary | ICD-10-CM | POA: Diagnosis not present

## 2014-11-21 DIAGNOSIS — R5383 Other fatigue: Secondary | ICD-10-CM | POA: Diagnosis not present

## 2014-11-21 DIAGNOSIS — I2510 Atherosclerotic heart disease of native coronary artery without angina pectoris: Secondary | ICD-10-CM | POA: Diagnosis not present

## 2014-11-21 DIAGNOSIS — I10 Essential (primary) hypertension: Secondary | ICD-10-CM | POA: Diagnosis not present

## 2014-11-21 DIAGNOSIS — R21 Rash and other nonspecific skin eruption: Secondary | ICD-10-CM | POA: Diagnosis not present

## 2014-11-21 DIAGNOSIS — Z923 Personal history of irradiation: Secondary | ICD-10-CM | POA: Diagnosis not present

## 2014-11-21 DIAGNOSIS — R0602 Shortness of breath: Secondary | ICD-10-CM | POA: Diagnosis not present

## 2014-11-21 DIAGNOSIS — C3411 Malignant neoplasm of upper lobe, right bronchus or lung: Secondary | ICD-10-CM | POA: Diagnosis not present

## 2014-11-21 DIAGNOSIS — C3490 Malignant neoplasm of unspecified part of unspecified bronchus or lung: Secondary | ICD-10-CM | POA: Diagnosis not present

## 2014-11-21 DIAGNOSIS — Z87891 Personal history of nicotine dependence: Secondary | ICD-10-CM | POA: Diagnosis not present

## 2014-11-21 DIAGNOSIS — K469 Unspecified abdominal hernia without obstruction or gangrene: Secondary | ICD-10-CM | POA: Diagnosis not present

## 2014-11-21 DIAGNOSIS — Z85820 Personal history of malignant melanoma of skin: Secondary | ICD-10-CM | POA: Diagnosis not present

## 2014-11-21 DIAGNOSIS — I313 Pericardial effusion (noninflammatory): Secondary | ICD-10-CM | POA: Diagnosis not present

## 2014-11-28 ENCOUNTER — Other Ambulatory Visit: Payer: Self-pay | Admitting: Nurse Practitioner

## 2014-11-28 MED ORDER — FENOFIBRATE 145 MG PO TABS: 145.0000 mg | ORAL_TABLET | Freq: Every day | ORAL | Status: DC

## 2014-11-28 MED ORDER — OMEPRAZOLE 40 MG PO CPDR: 40.0000 mg | DELAYED_RELEASE_CAPSULE | Freq: Every day | ORAL | Status: DC

## 2015-01-01 ENCOUNTER — Ambulatory Visit (INDEPENDENT_AMBULATORY_CARE_PROVIDER_SITE_OTHER): Payer: Medicare Other | Admitting: Physician Assistant

## 2015-01-01 ENCOUNTER — Encounter: Payer: Self-pay | Admitting: Physician Assistant

## 2015-01-01 VITALS — BP 131/67 | HR 85 | Temp 99.7°F | Ht 69.0 in | Wt 242.2 lb

## 2015-01-01 DIAGNOSIS — Z872 Personal history of diseases of the skin and subcutaneous tissue: Secondary | ICD-10-CM | POA: Diagnosis not present

## 2015-01-01 DIAGNOSIS — R062 Wheezing: Secondary | ICD-10-CM | POA: Diagnosis not present

## 2015-01-01 DIAGNOSIS — L309 Dermatitis, unspecified: Secondary | ICD-10-CM | POA: Diagnosis not present

## 2015-01-01 DIAGNOSIS — L570 Actinic keratosis: Secondary | ICD-10-CM | POA: Diagnosis not present

## 2015-01-01 MED ORDER — ALBUTEROL SULFATE HFA 108 (90 BASE) MCG/ACT IN AERS: 2.0000 | INHALATION_SPRAY | Freq: Four times a day (QID) | RESPIRATORY_TRACT | Status: DC | PRN

## 2015-01-01 MED ORDER — FLUTICASONE PROPIONATE 0.05 % EX CREA: TOPICAL_CREAM | Freq: Two times a day (BID) | CUTANEOUS | Status: DC

## 2015-01-01 NOTE — Progress Notes (Signed)
   Subjective:    Patient ID: Scott Bird, male    DOB: 1946/05/08, 69 y.o.   MRN: [id number]  HPI 69 y/o male with comorbid lung CA, h/o bladder/prostate/duodenum/ and h/o Melanoma presents with pruritic rash on posterior shoulder blades, BUE and around his neck x 1 year. Saw dermatologist 1 year but rash was not present at his visit. Worse at night. Has tried benadryl cream with some relief. Uses Dove soap.     Review of Systems  Respiratory: Positive for cough (productive with white substance. ) and wheezing (chronic d/t h/o lung CA). Negative for choking and chest tightness. Shortness of breath: chronic.   Skin: Positive for color change (redness ) and rash.       Pruritic rash on BUE and posterior shoulder   Psychiatric/Behavioral: The patient is hyperactive.        Objective:   Physical Exam  Constitutional: He is oriented to person, place, and time. He appears well-developed and well-nourished.  Neurological: He is alert and oriented to person, place, and time.  Skin: Skin is dry. Rash noted. There is erythema (erythematous patch plaques on chest, BUE and back ).  Psychiatric: He has a normal mood and affect. His behavior is normal. Judgment and thought content normal.  Nursing note and vitals reviewed.         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  2. Dermatitis trunk, BUE: Change to [employer]. Cutivate cream bid. Aveeno eczema lotion or dry skin lotion prn for itch. Do not scratch. Zyrtec 10mg  q day - bid for pruritis.   3. Cough with wheeze: Albuterol inhaler q 4-6 hrs until f/u in 2 weeks.

## 2015-01-01 NOTE — Patient Instructions (Addendum)
Use dove soap only.  Apply Aveeno dry skin or Aveeno eczema lotion as often as needed to affected area of itch. Use within 3 minutes of shower or bath.  Apply prescribed cream twice daily to affected area - fingertip amount will be enough to cover 2 hands Take Zyrtec 10mg  once to twice daily for itch. If causes drowsiness, take at bedtime.   Use inhaler every 6 hours until f/u. If cough worsens or /shortness of breath develops follow up sooner.

## 2015-01-02 ENCOUNTER — Other Ambulatory Visit: Payer: Self-pay | Admitting: *Deleted

## 2015-01-02 MED ORDER — TRIAMCINOLONE ACETONIDE 0.1 % EX CREA: 1.0000 "application " | TOPICAL_CREAM | Freq: Two times a day (BID) | CUTANEOUS | Status: DC

## 2015-01-06 ENCOUNTER — Other Ambulatory Visit: Payer: Self-pay | Admitting: Nurse Practitioner

## 2015-01-08 NOTE — Telephone Encounter (Signed)
no more refills without being seen  

## 2015-01-08 NOTE — Telephone Encounter (Signed)
Last labs 01/2014

## 2015-01-09 ENCOUNTER — Other Ambulatory Visit: Payer: Self-pay | Admitting: *Deleted

## 2015-01-09 MED ORDER — FENOFIBRATE 145 MG PO TABS: 145.0000 mg | ORAL_TABLET | Freq: Every day | ORAL | Status: DC

## 2015-01-10 ENCOUNTER — Other Ambulatory Visit (INDEPENDENT_AMBULATORY_CARE_PROVIDER_SITE_OTHER): Payer: Medicare Other | Admitting: *Deleted

## 2015-01-10 DIAGNOSIS — Z23 Encounter for immunization: Secondary | ICD-10-CM

## 2015-01-10 NOTE — Progress Notes (Signed)
Prevnar 13 given and pt tolerated well

## 2015-01-23 ENCOUNTER — Encounter: Payer: Self-pay | Admitting: Physician Assistant

## 2015-01-23 ENCOUNTER — Ambulatory Visit (INDEPENDENT_AMBULATORY_CARE_PROVIDER_SITE_OTHER): Payer: Medicare Other | Admitting: Physician Assistant

## 2015-01-23 VITALS — BP 136/66 | HR 76 | Temp 98.1°F | Ht 69.0 in | Wt 248.0 lb

## 2015-01-23 DIAGNOSIS — R062 Wheezing: Secondary | ICD-10-CM | POA: Diagnosis not present

## 2015-01-23 DIAGNOSIS — J209 Acute bronchitis, unspecified: Secondary | ICD-10-CM

## 2015-01-23 MED ORDER — PREDNISONE 10 MG (21) PO TBPK: ORAL_TABLET | ORAL | Status: DC

## 2015-01-23 MED ORDER — ALBUTEROL SULFATE HFA 108 (90 BASE) MCG/ACT IN AERS: 2.0000 | INHALATION_SPRAY | Freq: Four times a day (QID) | RESPIRATORY_TRACT | Status: DC | PRN

## 2015-01-23 MED ORDER — AZITHROMYCIN 250 MG PO TABS: ORAL_TABLET | ORAL | Status: DC

## 2015-01-23 NOTE — Progress Notes (Signed)
   Subjective:    Patient ID: Scott Bird, male    DOB: 04-Apr-1946, 69 y.o.   MRN: [id number]  HPI 69 y/o male presents for f/u of dermatitis and SOB with cough. He states that the itch has improved as well as SOB with use of TAC, change to Dove soap and albuterol every 6-8 hours.     Review of Systems  Respiratory: Positive for cough (worse in the morning. Productive ) and shortness of breath (only during exertion ). Negative for wheezing.   Skin: Positive for rash (decreasing rash on shoulder blad. ).       Decreased itch        Objective:   Physical Exam  Constitutional: He is oriented to person, place, and time. He appears well-developed and well-nourished. No distress.  Cardiovascular: Normal rate, regular rhythm and normal heart sounds.   Pulmonary/Chest: Effort normal. No respiratory distress. He has wheezes.  Increased "tightness" and decreased BS in right posterior BS.   Neurological: He is alert and oriented to person, place, and time.  Skin: He is not diaphoretic.  Decreased dryness and rash on back and BUE  Nursing note and vitals reviewed.         Assessment & Plan:  1. Acute bronchitis, unspecified organism  - azithromycin (ZITHROMAX) 250 MG tablet; Take 2 tablets on day 1, then 1 tablet 2-5  Dispense: 6 tablet; Refill: 0 - predniSONE (STERAPRED UNI-PAK 21 TAB) 10 MG (21) TBPK tablet; Take as directed.  Dispense: 21 tablet; Refill: 0 - Will order xray at f/u if needed. Hesitant today d/t patient h/o significant radiation exposure during CA tx of right lung. He is also seeing Pulmonologist next month and will  Likely have xray then   2. Inspiratory wheeze on examination  - albuterol (PROVENTIL HFA;VENTOLIN HFA) 108 (90 BASE) MCG/ACT inhaler; Inhale 2 puffs into the lungs every 6 (six) hours as needed for wheezing or shortness of breath.  Dispense: 1 Inhaler; Refill: 3  3. Dermatitis  - Continue dove soap, TAC and zyrtec '10mg'$  Continue all meds  RTC 2  weeks

## 2015-01-26 ENCOUNTER — Ambulatory Visit: Payer: Medicare Other | Admitting: *Deleted

## 2015-02-06 ENCOUNTER — Ambulatory Visit (INDEPENDENT_AMBULATORY_CARE_PROVIDER_SITE_OTHER): Payer: Medicare Other | Admitting: Physician Assistant

## 2015-02-06 ENCOUNTER — Encounter: Payer: Self-pay | Admitting: Physician Assistant

## 2015-02-06 VITALS — BP 133/69 | HR 89 | Temp 97.3°F | Ht 69.0 in | Wt 244.0 lb

## 2015-02-06 DIAGNOSIS — R05 Cough: Secondary | ICD-10-CM | POA: Diagnosis not present

## 2015-02-06 DIAGNOSIS — L309 Dermatitis, unspecified: Secondary | ICD-10-CM

## 2015-02-06 DIAGNOSIS — R059 Cough, unspecified: Secondary | ICD-10-CM

## 2015-02-06 DIAGNOSIS — L859 Epidermal thickening, unspecified: Secondary | ICD-10-CM

## 2015-02-06 DIAGNOSIS — L821 Other seborrheic keratosis: Secondary | ICD-10-CM | POA: Diagnosis not present

## 2015-02-06 NOTE — Progress Notes (Signed)
   Subjective:    Patient ID: BOWEN KIA, male    DOB: 06-05-1946, 69 y.o.   MRN: [id number]  HPI 69 y/o male presents for f/u of cough and dermatitis. She states that he if feeling better regarding cough and dermatitis has been controlled with Dove and Aveeno.     Review of Systems  Constitutional: Negative.   HENT: Negative.   Respiratory: Positive for cough (occasional, at times productive, mostly in the morning ) and shortness of breath (chronic). Negative for wheezing.   Cardiovascular: Negative.   All other systems reviewed and are negative.      Objective:   Physical Exam  Cardiovascular: Normal rate, regular rhythm and normal heart sounds.  Exam reveals no gallop and no friction rub.   No murmur heard. Pulmonary/Chest: Effort normal. No respiratory distress. He has no wheezes. He has no rales. He exhibits no tenderness.  Much improved. No wheezing heard on auscultation    Skin:  Hyperkeratotic atypical appearing lesion noted superior to right ear.   Psychiatric: He has a normal mood and affect. His behavior is normal. Judgment and thought content normal.  Nursing note and vitals reviewed.         Assessment & Plan:  1. Cough RTO if recurs.   2. Hyperkeratosis Lesion superior to right ear biopsied to r/o dysplasia.  3. Dermatitis Continue dove soap, avenno lotion and TAC   RTO prn   Edie Vallandingham A. Benjamin Stain PA-C

## 2015-02-06 NOTE — Addendum Note (Signed)
Addended by: Marline Backbone A on: 02/06/2015 04:58 PM   Modules accepted: Orders

## 2015-02-08 LAB — PATHOLOGY

## 2015-02-20 DIAGNOSIS — C3400 Malignant neoplasm of unspecified main bronchus: Secondary | ICD-10-CM | POA: Diagnosis not present

## 2015-02-20 DIAGNOSIS — C3491 Malignant neoplasm of unspecified part of right bronchus or lung: Secondary | ICD-10-CM | POA: Diagnosis not present

## 2015-02-20 DIAGNOSIS — Z87891 Personal history of nicotine dependence: Secondary | ICD-10-CM | POA: Diagnosis not present

## 2015-02-20 DIAGNOSIS — I2510 Atherosclerotic heart disease of native coronary artery without angina pectoris: Secondary | ICD-10-CM | POA: Diagnosis not present

## 2015-02-20 DIAGNOSIS — C3411 Malignant neoplasm of upper lobe, right bronchus or lung: Secondary | ICD-10-CM | POA: Diagnosis not present

## 2015-02-20 DIAGNOSIS — Z85828 Personal history of other malignant neoplasm of skin: Secondary | ICD-10-CM | POA: Diagnosis not present

## 2015-02-20 DIAGNOSIS — Z85820 Personal history of malignant melanoma of skin: Secondary | ICD-10-CM | POA: Diagnosis not present

## 2015-02-20 DIAGNOSIS — Z8546 Personal history of malignant neoplasm of prostate: Secondary | ICD-10-CM | POA: Diagnosis not present

## 2015-02-20 DIAGNOSIS — E785 Hyperlipidemia, unspecified: Secondary | ICD-10-CM | POA: Diagnosis not present

## 2015-02-20 DIAGNOSIS — Z8551 Personal history of malignant neoplasm of bladder: Secondary | ICD-10-CM | POA: Diagnosis not present

## 2015-02-20 DIAGNOSIS — Z85118 Personal history of other malignant neoplasm of bronchus and lung: Secondary | ICD-10-CM | POA: Diagnosis not present

## 2015-02-20 DIAGNOSIS — Z08 Encounter for follow-up examination after completed treatment for malignant neoplasm: Secondary | ICD-10-CM | POA: Diagnosis not present

## 2015-02-20 DIAGNOSIS — K469 Unspecified abdominal hernia without obstruction or gangrene: Secondary | ICD-10-CM | POA: Diagnosis not present

## 2015-04-24 ENCOUNTER — Other Ambulatory Visit: Payer: Self-pay | Admitting: *Deleted

## 2015-04-24 DIAGNOSIS — R062 Wheezing: Secondary | ICD-10-CM

## 2015-04-24 MED ORDER — ALBUTEROL SULFATE HFA 108 (90 BASE) MCG/ACT IN AERS: 2.0000 | INHALATION_SPRAY | Freq: Four times a day (QID) | RESPIRATORY_TRACT | Status: AC | PRN

## 2015-05-15 ENCOUNTER — Encounter: Payer: Self-pay | Admitting: Nurse Practitioner

## 2015-05-15 ENCOUNTER — Ambulatory Visit (INDEPENDENT_AMBULATORY_CARE_PROVIDER_SITE_OTHER): Payer: Medicare Other | Admitting: Nurse Practitioner

## 2015-05-15 ENCOUNTER — Ambulatory Visit (INDEPENDENT_AMBULATORY_CARE_PROVIDER_SITE_OTHER): Payer: Medicare Other

## 2015-05-15 VITALS — BP 129/75 | HR 79 | Temp 97.9°F | Ht 69.0 in | Wt 246.0 lb

## 2015-05-15 DIAGNOSIS — R05 Cough: Secondary | ICD-10-CM | POA: Diagnosis not present

## 2015-05-15 DIAGNOSIS — J209 Acute bronchitis, unspecified: Secondary | ICD-10-CM

## 2015-05-15 DIAGNOSIS — R059 Cough, unspecified: Secondary | ICD-10-CM

## 2015-05-15 MED ORDER — BENZONATATE 100 MG PO CAPS: 100.0000 mg | ORAL_CAPSULE | Freq: Three times a day (TID) | ORAL | Status: DC | PRN

## 2015-05-15 MED ORDER — HYDROCODONE-HOMATROPINE 5-1.5 MG/5ML PO SYRP: 5.0000 mL | ORAL_SOLUTION | Freq: Four times a day (QID) | ORAL | Status: DC | PRN

## 2015-05-15 MED ORDER — CIPROFLOXACIN HCL 500 MG PO TABS: 500.0000 mg | ORAL_TABLET | Freq: Two times a day (BID) | ORAL | Status: DC

## 2015-05-15 MED ORDER — METHYLPREDNISOLONE ACETATE 80 MG/ML IJ SUSP
80.0000 mg | Freq: Once | INTRAMUSCULAR | Status: AC
Start: 2015-05-15 — End: 2015-05-15
  Administered 2015-05-15: 80 mg via INTRAMUSCULAR

## 2015-05-15 NOTE — Addendum Note (Signed)
Addended by: Chevis Pretty on: 05/15/2015 10:45 AM   Modules accepted: Level of Service

## 2015-05-15 NOTE — Progress Notes (Signed)
   Subjective:    Patient ID: Scott Bird, male    DOB: 07/08/1946, 69 y.o.   MRN: [id number]  HPI Patient brought in by wife with complaint of cough that started 2 months ago and has just gradually been worsening. Cough has gotten so bad that  He looses his breath when coughing. He stopped smoking 10 months ago due to lung cancer and had chemo and radiation. Say oncologist and the said he was still in remission. He has follow up appointment the middle of NOvember.    Review of Systems  Constitutional: Negative for fever and chills.  HENT: Positive for congestion.   Respiratory: Positive for cough (productive- greeninsh in color) and shortness of breath. Negative for chest tightness.   Cardiovascular: Negative.   Gastrointestinal: Negative.   Genitourinary: Negative.   Neurological: Negative.   Psychiatric/Behavioral: Negative.   All other systems reviewed and are negative.      Objective:   Physical Exam  Constitutional: He is oriented to person, place, and time. He appears well-developed and well-nourished. No distress.  HENT:  Right Ear: Hearing, tympanic membrane, external ear and ear canal normal.  Left Ear: Hearing, tympanic membrane, external ear and ear canal normal.  Nose: Mucosal edema and rhinorrhea present. Right sinus exhibits no maxillary sinus tenderness and no frontal sinus tenderness. Left sinus exhibits no maxillary sinus tenderness and no frontal sinus tenderness.  Mouth/Throat: Uvula is midline, oropharynx is clear and moist and mucous membranes are normal.  Eyes: Pupils are equal, round, and reactive to light.  Neck: Normal range of motion. Neck supple.  Cardiovascular: Normal rate, regular rhythm and normal heart sounds.   Pulmonary/Chest: Effort normal. He has wheezes (inspiratory).  Rhonchi bil bases  Abdominal: Soft. Bowel sounds are normal.  Lymphadenopathy:    He has no cervical adenopathy.  Neurological: He is alert and oriented to person, place,  and time.  Skin: Skin is warm and dry.  Psychiatric: He has a normal mood and affect. His behavior is normal. Judgment and thought content normal.   BP 129/75 mmHg  Pulse 79  Temp(Src) 97.9 F (36.6 C) (Oral)  Ht '5\' 9"'$  (1.753 m)  Wt 246 lb (111.585 kg)  BMI 36.31 kg/m2  SpO2 95%  Chest x ray- unchanged from previous-Preliminary reading by Ronnald Collum, FNP  [facility]       Assessment & Plan:  1. Cough - DG Chest 2 View; Future  2. Acute bronchitis, unspecified organism Force fluids Rest RTO prn - ciprofloxacin (CIPRO) 500 MG tablet; Take 1 tablet (500 mg total) by mouth 2 (two) times daily.  Dispense: 20 tablet; Refill: 0 - benzonatate (TESSALON PERLES) 100 MG capsule; Take 1 capsule (100 mg total) by mouth 3 (three) times daily as needed for cough.  Dispense: 20 capsule; Refill: 0 - HYDROcodone-homatropine (HYCODAN) 5-1.5 MG/5ML syrup; Take 5 mLs by mouth every 6 (six) hours as needed for cough.  Dispense: 120 mL; Refill: 0   Mary-Margaret Hassell Done, FNP  - methylPREDNISolone acetate (DEPO-MEDROL) injection 80 mg; Inject 1 mL (80 mg total) into the muscle once.

## 2015-05-15 NOTE — Patient Instructions (Signed)

## 2015-05-16 ENCOUNTER — Other Ambulatory Visit: Payer: Self-pay | Admitting: *Deleted

## 2015-05-16 DIAGNOSIS — R5383 Other fatigue: Secondary | ICD-10-CM

## 2015-05-17 ENCOUNTER — Other Ambulatory Visit (INDEPENDENT_AMBULATORY_CARE_PROVIDER_SITE_OTHER): Payer: Medicare Other

## 2015-05-17 DIAGNOSIS — R5383 Other fatigue: Secondary | ICD-10-CM | POA: Diagnosis not present

## 2015-05-18 LAB — ANEMIA PROFILE B
BASOS: 0 %
Basophils Absolute: 0 10*3/uL (ref 0.0–0.2)
EOS (ABSOLUTE): 0.2 10*3/uL (ref 0.0–0.4)
EOS: 2 %
Ferritin: 284 ng/mL (ref 30–400)
Folate: 15.6 ng/mL (ref >3.0)
HEMATOCRIT: 40.5 % (ref 37.5–51.0)
HEMOGLOBIN: 13.7 g/dL (ref 12.6–17.7)
Immature Grans (Abs): 0 10*3/uL (ref 0.0–0.1)
Immature Granulocytes: 0 %
Iron Saturation: 26 % (ref 15–55)
Iron: 67 ug/dL (ref 38–169)
LYMPHS ABS: 1.2 10*3/uL (ref 0.7–3.1)
Lymphs: 13 %
MCH: 30.9 pg (ref 26.6–33.0)
MCHC: 33.8 g/dL (ref 31.5–35.7)
MCV: 91 fL (ref 79–97)
MONOCYTES: 10 %
Monocytes Absolute: 1 10*3/uL — ABNORMAL HIGH (ref 0.1–0.9)
NEUTROS ABS: 6.9 10*3/uL (ref 1.4–7.0)
Neutrophils: 75 %
Platelets: 175 10*3/uL (ref 150–379)
RBC: 4.44 x10E6/uL (ref 4.14–5.80)
RDW: 14.2 % (ref 12.3–15.4)
Retic Ct Pct: 2.1 % (ref 0.6–2.6)
TIBC: 259 ug/dL (ref 250–450)
UIBC: 192 ug/dL (ref 111–343)
Vitamin B-12: 799 pg/mL (ref 211–946)
WBC: 9.3 10*3/uL (ref 3.4–10.8)

## 2015-05-18 LAB — CMP14+EGFR
ALBUMIN: 4.1 g/dL (ref 3.5–4.8)
ALT: 26 IU/L (ref 0–44)
AST: 21 IU/L (ref 0–40)
Albumin/Globulin Ratio: 1.9 (ref 1.1–2.5)
Alkaline Phosphatase: 49 IU/L (ref 39–117)
BUN/Creatinine Ratio: 13 (ref 10–22)
BUN: 28 mg/dL — AB (ref 8–27)
CALCIUM: 9.1 mg/dL (ref 8.6–10.2)
CHLORIDE: 100 mmol/L (ref 97–108)
CO2: 22 mmol/L (ref 18–29)
Creatinine, Ser: 2.15 mg/dL — ABNORMAL HIGH (ref 0.76–1.27)
GFR, EST AFRICAN AMERICAN: 35 mL/min/{1.73_m2} — AB (ref >59)
GFR, EST NON AFRICAN AMERICAN: 30 mL/min/{1.73_m2} — AB (ref >59)
GLUCOSE: 116 mg/dL — AB (ref 65–99)
Globulin, Total: 2.2 g/dL (ref 1.5–4.5)
Potassium: 4.8 mmol/L (ref 3.5–5.2)
Sodium: 142 mmol/L (ref 134–144)
TOTAL PROTEIN: 6.3 g/dL (ref 6.0–8.5)

## 2015-06-02 ENCOUNTER — Other Ambulatory Visit: Payer: Self-pay | Admitting: Nurse Practitioner

## 2015-06-04 ENCOUNTER — Other Ambulatory Visit: Payer: Self-pay | Admitting: *Deleted

## 2015-06-04 MED ORDER — OMEPRAZOLE 40 MG PO CPDR: 40.0000 mg | DELAYED_RELEASE_CAPSULE | Freq: Every day | ORAL | Status: DC

## 2015-07-26 ENCOUNTER — Ambulatory Visit (INDEPENDENT_AMBULATORY_CARE_PROVIDER_SITE_OTHER): Payer: Medicare Other

## 2015-07-26 DIAGNOSIS — Z23 Encounter for immunization: Secondary | ICD-10-CM | POA: Diagnosis not present

## 2015-08-02 ENCOUNTER — Other Ambulatory Visit: Payer: Self-pay | Admitting: Nurse Practitioner

## 2015-08-02 ENCOUNTER — Ambulatory Visit (HOSPITAL_COMMUNITY)
Admission: RE | Admit: 2015-08-02 | Discharge: 2015-08-02 | Disposition: A | Discharge status: Home / Self Care | Payer: Medicare Other | Source: Ambulatory Visit | Attending: Nurse Practitioner | Admitting: Nurse Practitioner

## 2015-08-02 ENCOUNTER — Encounter: Payer: Self-pay | Admitting: Nurse Practitioner

## 2015-08-02 ENCOUNTER — Ambulatory Visit (INDEPENDENT_AMBULATORY_CARE_PROVIDER_SITE_OTHER): Payer: Medicare Other | Admitting: Nurse Practitioner

## 2015-08-02 VITALS — BP 125/73 | HR 86 | Temp 98.7°F | Ht 69.0 in | Wt 243.0 lb

## 2015-08-02 DIAGNOSIS — Z85118 Personal history of other malignant neoplasm of bronchus and lung: Secondary | ICD-10-CM | POA: Insufficient documentation

## 2015-08-02 DIAGNOSIS — F05 Delirium due to known physiological condition: Secondary | ICD-10-CM

## 2015-08-02 DIAGNOSIS — Z8551 Personal history of malignant neoplasm of bladder: Secondary | ICD-10-CM

## 2015-08-02 DIAGNOSIS — R410 Disorientation, unspecified: Secondary | ICD-10-CM | POA: Insufficient documentation

## 2015-08-02 NOTE — Addendum Note (Signed)
Addended by: Chevis Pretty on: 08/02/2015 01:44 PM   Modules accepted: Orders

## 2015-08-02 NOTE — Progress Notes (Signed)
   Subjective:    Patient ID: Scott Bird, male    DOB: 03-05-1946, 69 y.o.   MRN: [id number]  HPI Scott Bird comes in today with his wife c/o confusion. He woke up this morning and did not know why his grandkids with at their house and they had been there all night because their mom was out of town. He is having frequent occurrences of not remembering what he has done. Had episode several weeks ago where his speech was slurred- He has a long history of cancer and hypertension. Has never had a stroke in the past that he is aware of.    Review of Systems  Constitutional: Negative.   HENT: Negative.   Respiratory: Negative.   Cardiovascular: Negative.   Genitourinary: Negative.   Neurological: Positive for speech difficulty. Negative for dizziness, tremors, weakness and headaches.  Psychiatric/Behavioral: Negative.   All other systems reviewed and are negative.      Objective:   Physical Exam  Constitutional: He is oriented to person, place, and time. He appears well-developed and well-nourished.  Cardiovascular: Normal rate, regular rhythm and normal heart sounds.   Pulmonary/Chest: Effort normal and breath sounds normal.  Abdominal: Soft. Bowel sounds are normal.  Neurological: He is alert and oriented to person, place, and time. No cranial nerve deficit.  Skin: Skin is warm and dry.  Psychiatric: He has a normal mood and affect. His behavior is normal. Judgment and thought content normal.    BP 125/73 mmHg  Pulse 86  Temp(Src) 98.7 F (37.1 C) (Oral)  Ht '5\' 9"'$  (1.753 m)  Wt 243 lb (110.224 kg)  BMI 35.87 kg/m2       Assessment & Plan:   1. Acute confusional state   2. Hx of bladder cancer    Orders Placed This Encounter  Procedures  . CT Head W Wo Contrast    Standing Status: Future     Number of Occurrences:      Standing Expiration Date: 11/01/2016    Order Specific Question:  If indicated for the ordered procedure, I authorize the administration of  contrast media per Radiology protocol    Answer:  Yes    Order Specific Question:  Reason for Exam (SYMPTOM  OR DIAGNOSIS REQUIRED)    Answer:  confusion- hx of bladder CA, and lung CA    Order Specific Question:  Preferred imaging location?    Answer:  [place], [place]

## 2015-08-20 ENCOUNTER — Ambulatory Visit (INDEPENDENT_AMBULATORY_CARE_PROVIDER_SITE_OTHER): Payer: Medicare Other | Admitting: Nurse Practitioner

## 2015-08-20 ENCOUNTER — Encounter: Payer: Self-pay | Admitting: Nurse Practitioner

## 2015-08-20 VITALS — BP 121/61 | HR 71 | Temp 98.2°F | Ht 69.0 in | Wt 246.0 lb

## 2015-08-20 DIAGNOSIS — I10 Essential (primary) hypertension: Secondary | ICD-10-CM

## 2015-08-20 DIAGNOSIS — C3430 Malignant neoplasm of lower lobe, unspecified bronchus or lung: Secondary | ICD-10-CM | POA: Diagnosis not present

## 2015-08-20 DIAGNOSIS — Z8551 Personal history of malignant neoplasm of bladder: Secondary | ICD-10-CM | POA: Diagnosis not present

## 2015-08-20 DIAGNOSIS — Z85068 Personal history of other malignant neoplasm of small intestine: Secondary | ICD-10-CM

## 2015-08-20 DIAGNOSIS — Z8546 Personal history of malignant neoplasm of prostate: Secondary | ICD-10-CM

## 2015-08-20 DIAGNOSIS — C3490 Malignant neoplasm of unspecified part of unspecified bronchus or lung: Secondary | ICD-10-CM | POA: Insufficient documentation

## 2015-08-20 DIAGNOSIS — E785 Hyperlipidemia, unspecified: Secondary | ICD-10-CM | POA: Diagnosis not present

## 2015-08-20 MED ORDER — MESALAMINE 1.2 G PO TBEC: 1200.0000 mg | DELAYED_RELEASE_TABLET | Freq: Every day | ORAL | Status: DC

## 2015-08-20 NOTE — Progress Notes (Signed)
 Subjective:    Patient ID: Scott Bird, male    DOB: 12/27/1945, 69 y.o.   MRN: [id number]  Hypertension This is a chronic problem. The current episode started more than 1 year ago. The problem is unchanged. The problem is controlled. Pertinent negatives include no anxiety, blurred vision, chest pain, headaches or shortness of breath. Risk factors for coronary artery disease include dyslipidemia, obesity and sedentary lifestyle. Past treatments include diuretics and ACE inhibitors. The current treatment provides moderate improvement. Compliance problems include diet and exercise.  Hypertensive end-organ damage includes CAD/MI. There is no history of heart failure.  Hyperlipidemia This is a chronic problem. The current episode started more than 1 year ago. The problem is controlled. Recent lipid tests were reviewed and are variable. Pertinent negatives include no chest pain or shortness of breath. Current antihyperlipidemic treatment includes statins. The current treatment provides moderate improvement of lipids. Compliance problems include adherence to diet and adherence to exercise.  Risk factors for coronary artery disease include dyslipidemia, hypertension, male sex and obesity.  GERD Omeprazole daily- keeps symptoms under control most of the time. IBS Currently on lialda which helps keep cramping and diarrhea under control. Lung CA/ h/o prostate cancer/hx bladder cancer/hx duodenal cancer  Has finished treatments and has recheck with oncologist every 2 months    Review of Systems  Constitutional: Negative.   HENT: Negative.   Eyes: Negative for blurred vision.  Respiratory: Negative for shortness of breath.   Cardiovascular: Negative for chest pain.  Gastrointestinal: Negative.   Genitourinary: Negative.   Neurological: Negative for headaches.  Psychiatric/Behavioral: Negative.   All other systems reviewed and are negative.      Objective:   Physical Exam  Constitutional:  He is oriented to person, place, and time. He appears well-developed and well-nourished.  HENT:  Head: Normocephalic.  Right Ear: External ear normal.  Left Ear: External ear normal.  Nose: Nose normal.  Mouth/Throat: Oropharynx is clear and moist.  Eyes: EOM are normal. Pupils are equal, round, and reactive to light.  Neck: Normal range of motion. Neck supple. No JVD present. No thyromegaly present.  Cardiovascular: Normal rate, regular rhythm, normal heart sounds and intact distal pulses.  Exam reveals no gallop and no friction rub.   No murmur heard. Pulmonary/Chest: Effort normal and breath sounds normal. No respiratory distress. He has no wheezes. He has no rales. He exhibits no tenderness.  Abdominal: Soft. Bowel sounds are normal. He exhibits no mass. There is no tenderness.  Genitourinary: Prostate normal and penis normal.  Musculoskeletal: Normal range of motion. He exhibits no edema.  Lymphadenopathy:    He has no cervical adenopathy.  Neurological: He is alert and oriented to person, place, and time. No cranial nerve deficit.  Skin: Skin is warm and dry.  Psychiatric: He has a normal mood and affect. His behavior is normal. Judgment and thought content normal.    BP 121/61 mmHg  Pulse 71  Temp(Src) 98.2 F (36.8 C) (Oral)  Ht 5' 9" (1.753 m)  Wt 246 lb (111.585 kg)  BMI 36.31 kg/m2       Assessment & Plan:  1. Essential hypertension Do not add salt to diet - CMP14+EGFR  2. Hyperlipidemia Low fat diet - Lipid panel  3. H/O prostate cancer  4. Hx of bladder cancer  5. History of duodenal cancer  6. 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 Morbid obesity, unspecified obesity type (HCC)   Discussed diet and exercise for person with BMI >25 Will recheck weight in 3-6 months     Labs pending Health maintenance reviewed Diet and exercise encouraged Continue all meds Follow up  In 6 months     Mary-Margaret , FNP    

## 2015-08-20 NOTE — Patient Instructions (Signed)

## 2015-08-21 ENCOUNTER — Other Ambulatory Visit: Payer: Self-pay | Admitting: Nurse Practitioner

## 2015-08-21 DIAGNOSIS — N183 Chronic kidney disease, stage 3 unspecified: Secondary | ICD-10-CM | POA: Insufficient documentation

## 2015-08-21 DIAGNOSIS — C3411 Malignant neoplasm of upper lobe, right bronchus or lung: Secondary | ICD-10-CM | POA: Diagnosis not present

## 2015-08-21 DIAGNOSIS — C3401 Malignant neoplasm of right main bronchus: Secondary | ICD-10-CM | POA: Diagnosis not present

## 2015-08-21 DIAGNOSIS — Z0389 Encounter for observation for other suspected diseases and conditions ruled out: Secondary | ICD-10-CM | POA: Diagnosis not present

## 2015-08-21 LAB — CMP14+EGFR
A/G RATIO: 1.9 (ref 1.1–2.5)
ALT: 28 IU/L (ref 0–44)
AST: 25 IU/L (ref 0–40)
Albumin: 4.3 g/dL (ref 3.5–4.8)
Alkaline Phosphatase: 41 IU/L (ref 39–117)
BUN/Creatinine Ratio: 13 (ref 10–22)
BUN: 26 mg/dL (ref 8–27)
Bilirubin Total: 0.4 mg/dL (ref 0.0–1.2)
CALCIUM: 9.7 mg/dL (ref 8.6–10.2)
CO2: 23 mmol/L (ref 18–29)
Chloride: 105 mmol/L (ref 97–106)
Creatinine, Ser: 2.03 mg/dL — ABNORMAL HIGH (ref 0.76–1.27)
GFR calc Af Amer: 37 mL/min/{1.73_m2} — ABNORMAL LOW (ref >59)
GFR, EST NON AFRICAN AMERICAN: 32 mL/min/{1.73_m2} — AB (ref >59)
GLUCOSE: 86 mg/dL (ref 65–99)
Globulin, Total: 2.3 g/dL (ref 1.5–4.5)
POTASSIUM: 5.3 mmol/L — AB (ref 3.5–5.2)
Sodium: 143 mmol/L (ref 136–144)
Total Protein: 6.6 g/dL (ref 6.0–8.5)

## 2015-08-21 LAB — LIPID PANEL
Chol/HDL Ratio: 7.2 ratio units — ABNORMAL HIGH (ref 0.0–5.0)
Cholesterol, Total: 222 mg/dL — ABNORMAL HIGH (ref 100–199)
HDL: 31 mg/dL — AB (ref >39)
LDL Calculated: 134 mg/dL — ABNORMAL HIGH (ref 0–99)
Triglycerides: 284 mg/dL — ABNORMAL HIGH (ref 0–149)
VLDL Cholesterol Cal: 57 mg/dL — ABNORMAL HIGH (ref 5–40)

## 2015-08-21 MED ORDER — ATORVASTATIN CALCIUM 40 MG PO TABS
40.0000 mg | ORAL_TABLET | Freq: Every day | ORAL | Status: DC
Start: 2015-08-21 — End: 2016-02-19

## 2015-08-22 NOTE — Progress Notes (Signed)
Patient aware.

## 2015-08-27 ENCOUNTER — Other Ambulatory Visit: Payer: Self-pay | Admitting: *Deleted

## 2015-08-27 MED ORDER — AZITHROMYCIN 250 MG PO TABS: ORAL_TABLET | ORAL | Status: DC

## 2015-09-04 DIAGNOSIS — R590 Localized enlarged lymph nodes: Secondary | ICD-10-CM | POA: Diagnosis not present

## 2015-09-04 DIAGNOSIS — Z0389 Encounter for observation for other suspected diseases and conditions ruled out: Secondary | ICD-10-CM | POA: Diagnosis not present

## 2015-09-04 DIAGNOSIS — Z9079 Acquired absence of other genital organ(s): Secondary | ICD-10-CM | POA: Diagnosis not present

## 2015-09-04 DIAGNOSIS — K439 Ventral hernia without obstruction or gangrene: Secondary | ICD-10-CM | POA: Diagnosis not present

## 2015-09-04 DIAGNOSIS — Z906 Acquired absence of other parts of urinary tract: Secondary | ICD-10-CM | POA: Diagnosis not present

## 2015-09-04 DIAGNOSIS — C3411 Malignant neoplasm of upper lobe, right bronchus or lung: Secondary | ICD-10-CM | POA: Diagnosis not present

## 2015-09-05 DIAGNOSIS — C3411 Malignant neoplasm of upper lobe, right bronchus or lung: Secondary | ICD-10-CM | POA: Diagnosis not present

## 2015-09-05 DIAGNOSIS — R5383 Other fatigue: Secondary | ICD-10-CM | POA: Diagnosis not present

## 2015-09-05 DIAGNOSIS — D499 Neoplasm of unspecified behavior of unspecified site: Secondary | ICD-10-CM | POA: Diagnosis not present

## 2015-09-05 DIAGNOSIS — Z6835 Body mass index (BMI) 35.0-35.9, adult: Secondary | ICD-10-CM | POA: Diagnosis not present

## 2015-09-05 DIAGNOSIS — J069 Acute upper respiratory infection, unspecified: Secondary | ICD-10-CM | POA: Diagnosis not present

## 2015-09-05 DIAGNOSIS — R202 Paresthesia of skin: Secondary | ICD-10-CM | POA: Diagnosis not present

## 2015-09-05 DIAGNOSIS — I10 Essential (primary) hypertension: Secondary | ICD-10-CM | POA: Diagnosis not present

## 2015-09-05 DIAGNOSIS — Z87891 Personal history of nicotine dependence: Secondary | ICD-10-CM | POA: Diagnosis not present

## 2015-09-05 DIAGNOSIS — R42 Dizziness and giddiness: Secondary | ICD-10-CM | POA: Diagnosis not present

## 2015-09-06 DIAGNOSIS — C771 Secondary and unspecified malignant neoplasm of intrathoracic lymph nodes: Secondary | ICD-10-CM | POA: Diagnosis not present

## 2015-09-06 DIAGNOSIS — R590 Localized enlarged lymph nodes: Secondary | ICD-10-CM | POA: Diagnosis not present

## 2015-09-06 DIAGNOSIS — C3490 Malignant neoplasm of unspecified part of unspecified bronchus or lung: Secondary | ICD-10-CM | POA: Diagnosis not present

## 2015-09-06 DIAGNOSIS — C3411 Malignant neoplasm of upper lobe, right bronchus or lung: Secondary | ICD-10-CM | POA: Diagnosis not present

## 2015-09-06 DIAGNOSIS — C770 Secondary and unspecified malignant neoplasm of lymph nodes of head, face and neck: Secondary | ICD-10-CM | POA: Diagnosis not present

## 2015-09-14 DIAGNOSIS — Z87891 Personal history of nicotine dependence: Secondary | ICD-10-CM | POA: Diagnosis not present

## 2015-09-14 DIAGNOSIS — Z79899 Other long term (current) drug therapy: Secondary | ICD-10-CM | POA: Diagnosis not present

## 2015-09-14 DIAGNOSIS — C3411 Malignant neoplasm of upper lobe, right bronchus or lung: Secondary | ICD-10-CM | POA: Diagnosis not present

## 2015-09-14 DIAGNOSIS — Z8619 Personal history of other infectious and parasitic diseases: Secondary | ICD-10-CM | POA: Diagnosis not present

## 2015-09-14 DIAGNOSIS — Z9221 Personal history of antineoplastic chemotherapy: Secondary | ICD-10-CM | POA: Diagnosis not present

## 2015-09-14 DIAGNOSIS — Z8546 Personal history of malignant neoplasm of prostate: Secondary | ICD-10-CM | POA: Diagnosis not present

## 2015-09-14 DIAGNOSIS — E785 Hyperlipidemia, unspecified: Secondary | ICD-10-CM | POA: Diagnosis not present

## 2015-09-15 ENCOUNTER — Other Ambulatory Visit: Payer: Self-pay | Admitting: Nurse Practitioner

## 2015-09-17 DIAGNOSIS — C3490 Malignant neoplasm of unspecified part of unspecified bronchus or lung: Secondary | ICD-10-CM | POA: Diagnosis not present

## 2015-09-19 ENCOUNTER — Encounter: Payer: Self-pay | Admitting: Neurology

## 2015-09-19 ENCOUNTER — Ambulatory Visit (INDEPENDENT_AMBULATORY_CARE_PROVIDER_SITE_OTHER): Payer: Medicare Other | Admitting: Neurology

## 2015-09-19 VITALS — BP 132/68 | HR 87 | Ht 69.0 in | Wt 246.0 lb

## 2015-09-19 DIAGNOSIS — Z859 Personal history of malignant neoplasm, unspecified: Secondary | ICD-10-CM | POA: Diagnosis not present

## 2015-09-19 DIAGNOSIS — R404 Transient alteration of awareness: Secondary | ICD-10-CM | POA: Diagnosis not present

## 2015-09-19 MED ORDER — DIAZEPAM 5 MG PO TABS: ORAL_TABLET | ORAL | Status: DC

## 2015-09-19 NOTE — Progress Notes (Signed)
NEUROLOGY CONSULTATION NOTE  [name] MRN: [id number] DOB: 1946/08/18  Referring provider: Chevis Pretty, FNP Primary care provider: Chevis Pretty, FNP  Reason for consult:  syncope  Thank you for your kind referral of Scott Bird for consultation of the above symptoms. Although his history is well known to you, please allow me to reiterate it for the purpose of our medical record. The patient was accompanied to the clinic by his wife who also provides collateral information. Records and images were personally reviewed where available.  HISTORY OF PRESENT ILLNESS: This is a pleasant 69 year old right-handed man with a history of cancer of the prostate, duodenum, bladder, and lung, presenting for evaluation of an amnestic episode that occurred last 08/02/2015. He woke up and went down to his wife and grandchildren in the kitchen. He saw his grandkids and wondered why they were there, if anybody was sick. He helped his wife get bowls in the kitchen, then went back to bed. When he woke up, he did not recall the prior events. His wife reported that when he first woke up, he seemed "odd, eyes looked kind of wild." He should have known the grandkids were there, because they had been there the night prior and had dinner with them. He cannot recall having any headache, dizziness, or focal numbness/tingling/weakness at that time. He reports sleeping well the night prior, no alcohol intake. He went to his PCP that day and reported he had frequent occurrences of not remembering what he had done. He reported an episode several weeks prior where his speech was slurred.  He and his wife today deny any prior similar episodes. He denies any prior confusion or forgetfulness today. He states his memory is "okay for a 69 year old," he has problems with name recall, occasionally forgets where he puts things and occasionally forgets to take his medication. He denies getting lost driving.  His wife has always been in charge of bills. He denies any gaps in time, no staring/unresponsive episodes,  olfactory/gustatory hallucinations, deja vu, rising epigastric sensation, focal numbness/tingling/weakness, myoclonic jerks.  He has mild headaches occurring a couple times a week over the bilateral temporal regions, described as a "hollow" feeling, lasting 15 minutes to a couple of hours, no associated nausea, vomiting, photo/phonophobia. He does not take any medication for the headaches. No dizziness, diplopia, dysarthria, dysphagia, neck/back pain, bowel/bladder dysfunction. He reports an episode of sudden blurred vision last November while driving, lasting 36-62 minutes, no other symptoms. He has had 3 syncopal episodes all occurring in the setting of violent coughing, lasting 30-45 seconds, no post-event confusion. 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 He also has a diagnosis of non-small cell lung CA. He has a history of surgery, chemotherapy, and radiotherapy for the cancers, last radiation for lung cancer was last year. He is getting ready to start therapy again for lung and lymph node cancer recurrence. His grandson had febrile seizures. Otherwise, he had a normal birth and early development.  There is no history of febrile convulsions, CNS infections such as meningitis/encephalitis, significant traumatic brain injury, neurosurgical procedures.  I personally reviewed head CT without contrast done 08/02/2015 which did not show any acute changes. There was note of diffuse atrophy, more over the bilateral frontal regions, mild empty sella. An MRI brain report from [facility] done 01/27/14 reported age-advanced cerebral atrophy, no evidence of acute intracranial  abnormality. PET scan done 09/04/15 showed 2 malignant range mediastinal lymph nodes, concerning for metastases; multiple  borderline enlarged hypermetabolic left cervical lymph nodes concerning for malignancy or infection; similar appearance of postradiation changes of the right hilum and right lung; status post cystoprostatectomy with pelvic lymph node dissection and neobladder formation; ventral abdominal wall hernia containing loops of bowel.   PAST MEDICAL HISTORY: Past Medical History  Diagnosis Date  . Cataract   . COPD (chronic obstructive pulmonary disease) ([place])   . Chronic kidney disease   . Cancer [facility])     prostate, bladder,duodenum,lung    PAST SURGICAL HISTORY: Past Surgical History  Procedure Laterality Date  . Vasectomy    . Tonsillectomy    . Eye surgery Right     Catarct Extraction  . Prostatectomy    . Bladder removal      MEDICATIONS: Current Outpatient Prescriptions on File Prior to Visit  Medication Sig Dispense Refill  . atorvastatin (LIPITOR) 40 MG tablet Take 1 tablet (40 mg total) by mouth daily. 90 tablet 1  . fenofibrate (TRICOR) 145 MG tablet Take 1 tablet (145 mg total) by mouth daily. 90 tablet 2  . lisinopril-hydrochlorothiazide (PRINZIDE,ZESTORETIC) 20-12.5 MG tablet TAKE ONE TABLET BY MOUTH ONCE DAILY 90 tablet 1  . mesalamine (LIALDA) 1.2 G EC tablet Take 1 tablet (1.2 g total) by mouth daily with breakfast. 30 tablet 5  . omeprazole (PRILOSEC) 40 MG capsule Take 1 capsule (40 mg total) by mouth daily. 90 capsule 1  . albuterol (PROVENTIL HFA;VENTOLIN HFA) 108 (90 BASE) MCG/ACT inhaler Inhale 2 puffs into the lungs every 6 (six) hours as needed for wheezing or shortness of breath. (Patient not taking: Reported on 09/19/2015) 1 Inhaler 3   No current facility-administered medications on file prior to visit.    ALLERGIES: No Known Allergies  FAMILY HISTORY: Family History  Problem Relation Age of Onset  . Cancer Father     colon, kidney  . Stroke Father   . Congestive Heart Failure Father   . Diabetes Mother   . Cancer Brother     melanoma    SOCIAL  HISTORY: Social History   Social History  . Marital Status: Married    Spouse Name: N/A  . Number of Children: 4  . Years of Education: N/A   Occupational History  . Retired    Social History Main Topics  . Smoking status: Former Research scientist (life sciences)  . Smokeless tobacco: Never Used  . Alcohol Use: 0.0 oz/week    0 Standard drinks or equivalent per week     Comment: Occ  . Drug Use: No  . Sexual Activity: Not on file   Other Topics Concern  . Not on file   Social History Narrative    REVIEW OF SYSTEMS: Constitutional: No fevers, chills, or sweats, no generalized fatigue, change in appetite Eyes: No visual changes, double vision, eye pain Ear, nose and throat: No hearing loss, ear pain, nasal congestion, sore throat Cardiovascular: No chest pain, palpitations Respiratory:  No shortness of breath at rest or with exertion, wheezes GastrointestinaI: No nausea, vomiting, diarrhea, abdominal pain, fecal incontinence Genitourinary:  No dysuria, urinary retention or frequency Musculoskeletal:  No neck pain, back pain Integumentary: No rash, pruritus, skin lesions Neurological: as above Psychiatric: No depression, insomnia, anxiety Endocrine: No palpitations, fatigue, diaphoresis, mood swings, change in appetite, change in weight, increased thirst Hematologic/Lymphatic:  No anemia, purpura, petechiae. Allergic/Immunologic: no itchy/runny eyes, nasal congestion, recent allergic reactions, rashes  PHYSICAL EXAM: Filed  Vitals:   09/19/15 1019  BP: 132/68  Pulse: 87   General: No acute distress Head:  Normocephalic/atraumatic Eyes: Fundoscopic exam shows bilateral sharp discs, no vessel changes, exudates, or hemorrhages Neck: supple, no paraspinal tenderness, full range of motion Back: No paraspinal tenderness Heart: regular rate and rhythm Lungs: Clear to auscultation bilaterally. Vascular: No carotid bruits. Skin/Extremities: No rash, no edema Neurological Exam: Mental status: alert  and oriented to person, place, and time, no dysarthria or aphasia, Fund of knowledge is appropriate.  Recent and remote memory are intact. 3/3 delayed recall.  Attention and concentration are normal.    Able to name objects and repeat phrases. Cranial nerves: CN I: not tested CN II: pupils equal, round and reactive to light, visual fields intact, fundi unremarkable. CN III, IV, VI:  full range of motion, no nystagmus, no ptosis CN V: facial sensation intact CN VII: upper and lower face symmetric CN VIII: hearing intact to finger rub CN IX, X: gag intact, uvula midline CN XI: sternocleidomastoid and trapezius muscles intact CN XII: tongue midline Bulk & Tone: normal, no fasciculations. Motor: 5/5 throughout with no pronator drift. Sensation: decreased pin and cold on right LE, otherwise intact to light touch, vibration and joint position sense.  No extinction to double simultaneous stimulation.  Romberg test negative Deep Tendon Reflexes: +1 throughout, no ankle clonus Plantar responses: downgoing bilaterally Cerebellar: no incoordination on finger to nose, heel to shin. No dysdiadochokinesia Gait: narrow-based and steady, mild difficulty with tandem walk but able Tremor: none  IMPRESSION: This is a pleasant 69 year old right-handed man with a history of multiple cancers, including cancer of prostate, bladder, neuroendocrine tumor in duodenum, melanoma, and most recently non-small cell lung cancer with metastases to the lymph nodes, presenting for an amnestic episode that occurred on awakening last 08/02/15. The etiology of his symptoms is unclear, focal seizure is considered, other differentials include transient global amnesia, confusional awakening, although duration is atypical. With history of multiple cancers, MRI brain with and without contrast will be ordered to assess for underlying structural abnormality. Routine EEG will be done. [place] driving laws were discussed with the patient, and he  knows to stop driving after an episode of loss of consciousness/awareness, until 6 months event-free. He has not been driving since February. He will follow-up after the tests.   Thank you for allowing me to participate in the care of this patient. Please do not hesitate to call for any questions or concerns.   Ellouise Newer, M.D.

## 2015-09-19 NOTE — Patient Instructions (Addendum)
1. Schedule open MRI with and without contrast 2. Schedule routine EEG 3. May take Valium '5mg'$  one dose prior to MRI, may take second dose if needed 4. As per [place] driving laws, no driving after an episode of loss of consciousness or awareness until 6 months event-free 5. Follow-up after 1 month  6. You have been scheduled at [facility] for an MRI on 09/24/15. Please arrive @ 9:30 am.   [place], [place] [address]    [phone]

## 2015-09-27 ENCOUNTER — Telehealth: Payer: Self-pay | Admitting: Neurology

## 2015-09-27 DIAGNOSIS — R404 Transient alteration of awareness: Secondary | ICD-10-CM

## 2015-09-27 NOTE — Telephone Encounter (Signed)
PT called in regards to his MRI and has questions and would like a call back at [phone]

## 2015-09-27 NOTE — Telephone Encounter (Signed)
Returned call to patient. He states that he had to reschedule his MRI due to being sick. He did rescheduled his appt to 12/29. He states that he noticed when rescheduling his appt that his MRI brain is with contrast, he states that due to his past kidney issues his nephrologist advised him not to have any contrast with his imaging studies. Told patient that I would call [facility] and change the order to w/o contrast. I will fax a new order to [facility].

## 2015-10-04 DIAGNOSIS — R51 Headache: Secondary | ICD-10-CM | POA: Diagnosis not present

## 2015-10-04 DIAGNOSIS — R55 Syncope and collapse: Secondary | ICD-10-CM | POA: Diagnosis not present

## 2015-10-04 DIAGNOSIS — R413 Other amnesia: Secondary | ICD-10-CM | POA: Diagnosis not present

## 2015-10-04 DIAGNOSIS — R262 Difficulty in walking, not elsewhere classified: Secondary | ICD-10-CM | POA: Diagnosis not present

## 2015-10-04 DIAGNOSIS — G319 Degenerative disease of nervous system, unspecified: Secondary | ICD-10-CM | POA: Diagnosis not present

## 2015-10-04 DIAGNOSIS — H538 Other visual disturbances: Secondary | ICD-10-CM | POA: Diagnosis not present

## 2015-10-05 ENCOUNTER — Telehealth: Payer: Self-pay | Admitting: Family Medicine

## 2015-10-05 NOTE — Telephone Encounter (Signed)
-----   Message from Cameron Sprang, MD sent at 10/05/2015  3:18 PM EST ----- Regarding: mri results Pls let him know I reviewed MRI results and it is unremarkable, no evidence of tumor, stroke, or bleed. It shows age-related changes, thanks

## 2015-10-05 NOTE — Telephone Encounter (Signed)
Patient was notified of result.

## 2015-10-10 ENCOUNTER — Ambulatory Visit (INDEPENDENT_AMBULATORY_CARE_PROVIDER_SITE_OTHER): Payer: Medicare Other | Admitting: Neurology

## 2015-10-10 DIAGNOSIS — R404 Transient alteration of awareness: Secondary | ICD-10-CM | POA: Diagnosis not present

## 2015-10-17 ENCOUNTER — Ambulatory Visit (INDEPENDENT_AMBULATORY_CARE_PROVIDER_SITE_OTHER): Payer: Medicare Other | Admitting: Neurology

## 2015-10-17 ENCOUNTER — Encounter: Payer: Self-pay | Admitting: Neurology

## 2015-10-17 VITALS — BP 116/72 | HR 82 | Ht 69.0 in | Wt 246.0 lb

## 2015-10-17 DIAGNOSIS — R404 Transient alteration of awareness: Secondary | ICD-10-CM | POA: Diagnosis not present

## 2015-10-17 NOTE — Progress Notes (Signed)
NEUROLOGY FOLLOW UP OFFICE NOTE  MEL LANGAN [id number]  HISTORY OF PRESENT ILLNESS: I had the pleasure of seeing Rapheal Masso in follow-up in the neurology clinic on 10/16/2014.  The patient was last seen a month ago after an amnestic episode in October 2016. He is again accompanied by his wife who helps supplement the history today.  Records and images were personally reviewed where available.  His MRI brain with and without contrast was unremarkable, no acute changes. Routine EEG did not show any epileptiform discharges. He denies any further similar symptoms since then. He denies any headaches, dizziness, gaps in time, focal numbness/tingling/weakness. He is going to start cancer treatments again at the end of the month.  HPI 09/19/15: This is a pleasant 70 yo RH man with a history of cancer of the prostate, duodenum, bladder, and lung, who had an amnestic episode that occurred last 08/02/2015. He woke up and went down to his wife and grandchildren in the kitchen. He saw his grandkids and wondered why they were there, if anybody was sick. He helped his wife get bowls in the kitchen, then went back to bed. When he woke up, he did not recall the prior events. His wife reported that when he first woke up, he seemed "odd, eyes looked kind of wild." He should have known the grandkids were there, because they had been there the night prior and had dinner with them. He cannot recall having any headache, dizziness, or focal numbness/tingling/weakness at that time. He reports sleeping well the night prior, no alcohol intake. He went to his PCP that day and reported he had frequent occurrences of not remembering what he had done. He reported an episode several weeks prior where his speech was slurred. He and his wife today deny any prior similar episodes. He denies any prior confusion or forgetfulness today. He states his memory is "okay for a 70 year old," he has problems with name recall,  occasionally forgets where he puts things and occasionally forgets to take his medication. He denies getting lost driving. His wife has always been in charge of bills. He denies any gaps in time, no staring/unresponsive episodes, olfactory/gustatory hallucinations, deja vu, rising epigastric sensation, focal numbness/tingling/weakness, myoclonic jerks.  He has mild headaches occurring a couple times a week over the bilateral temporal regions, described as a "hollow" feeling, lasting 15 minutes to a couple of hours, no associated nausea, vomiting, photo/phonophobia. He does not take any medication for the headaches. No dizziness, diplopia, dysarthria, dysphagia, neck/back pain, bowel/bladder dysfunction. He reports an episode of sudden blurred vision last November while driving, lasting 06-26 minutes, no other symptoms. He has had 3 syncopal episodes all occurring in the setting of violent coughing, lasting 30-45 seconds, no post-event confusion. 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 He also has a diagnosis of non-small cell lung CA. He has a history of surgery, chemotherapy, and radiotherapy for the cancers, last radiation for lung cancer was last year. He is getting ready to start therapy again for lung and lymph node cancer recurrence. His grandson had febrile seizures. Otherwise, he had a normal birth and early development. There is no history of febrile convulsions, CNS infections such as meningitis/encephalitis, significant traumatic brain injury, neurosurgical procedures.  I personally reviewed head CT without contrast done 08/02/2015 which did not show any acute changes. There was note of diffuse atrophy, more over  the bilateral frontal regions, mild empty sella. An MRI brain report from [facility] done 01/27/14 reported age-advanced cerebral atrophy, no evidence of acute intracranial  abnormality. PET scan done 09/04/15 showed 2 malignant range mediastinal lymph nodes, concerning for metastases; multiple borderline enlarged hypermetabolic left cervical lymph nodes concerning for malignancy or infection; similar appearance of postradiation changes of the right hilum and right lung; status post cystoprostatectomy with pelvic lymph node dissection and neobladder formation; ventral abdominal wall hernia containing loops of bowel.  PAST MEDICAL HISTORY: Past Medical History  Diagnosis Date  . Cataract   . COPD (chronic obstructive pulmonary disease) ([place])   . Chronic kidney disease   . Cancer [facility])     prostate, bladder,duodenum,lung    MEDICATIONS: Current Outpatient Prescriptions on File Prior to Visit  Medication Sig Dispense Refill  . atorvastatin (LIPITOR) 40 MG tablet Take 1 tablet (40 mg total) by mouth daily. 90 tablet 1  . fenofibrate (TRICOR) 145 MG tablet Take 1 tablet (145 mg total) by mouth daily. 90 tablet 2  . lisinopril-hydrochlorothiazide (PRINZIDE,ZESTORETIC) 20-12.5 MG tablet TAKE ONE TABLET BY MOUTH ONCE DAILY 90 tablet 1  . mesalamine (LIALDA) 1.2 G EC tablet Take 1 tablet (1.2 g total) by mouth daily with breakfast. 30 tablet 5  . omeprazole (PRILOSEC) 40 MG capsule Take 1 capsule (40 mg total) by mouth daily. 90 capsule 1  . albuterol (PROVENTIL HFA;VENTOLIN HFA) 108 (90 BASE) MCG/ACT inhaler Inhale 2 puffs into the lungs every 6 (six) hours as needed for wheezing or shortness of breath. (Patient not taking: Reported on 09/19/2015) 1 Inhaler 3   No current facility-administered medications on file prior to visit.    ALLERGIES: No Known Allergies  FAMILY HISTORY: Family History  Problem Relation Age of Onset  . Cancer Father     colon, kidney  . Stroke Father   . Congestive Heart Failure Father   . Diabetes Mother   . Cancer Brother     melanoma    SOCIAL HISTORY: Social History   Social History  . Marital Status: Married    Spouse  Name: N/A  . Number of Children: 4  . Years of Education: N/A   Occupational History  . Retired    Social History Main Topics  . Smoking status: Former Research scientist (life sciences)  . Smokeless tobacco: Never Used  . Alcohol Use: 0.0 oz/week    0 Standard drinks or equivalent per week     Comment: Occ  . Drug Use: No  . Sexual Activity: Not on file   Other Topics Concern  . Not on file   Social History Narrative    REVIEW OF SYSTEMS: Constitutional: No fevers, chills, or sweats, no generalized fatigue, change in appetite Eyes: No visual changes, double vision, eye pain Ear, nose and throat: No hearing loss, ear pain, nasal congestion, sore throat Cardiovascular: No chest pain, palpitations Respiratory:  No shortness of breath at rest or with exertion, wheezes GastrointestinaI: No nausea, vomiting, diarrhea, abdominal pain, fecal incontinence Genitourinary:  No dysuria, urinary retention or frequency Musculoskeletal:  No neck pain, back pain Integumentary: No rash, pruritus, skin lesions Neurological: as above Psychiatric: No depression, insomnia, anxiety Endocrine: No palpitations, fatigue, diaphoresis, mood swings, change in appetite, change in weight, increased thirst Hematologic/Lymphatic:  No anemia, purpura, petechiae. Allergic/Immunologic: no itchy/runny eyes, nasal congestion, recent allergic reactions, rashes  PHYSICAL EXAM: Filed Vitals:   10/17/15 1309  BP: 116/72  Pulse: 82   General: No acute distress Head:  Normocephalic/atraumatic Neck: supple,  no paraspinal tenderness, full range of motion Heart:  Regular rate and rhythm Lungs:  Clear to auscultation bilaterally Back: No paraspinal tenderness Skin/Extremities: No rash, no edema Neurological Exam: alert and oriented to person, place, and time. No aphasia or dysarthria. Fund of knowledge is appropriate.  Recent and remote memory are intact.  Attention and concentration are normal.    Able to name objects and repeat phrases.  Cranial nerves: Pupils equal, round, reactive to light.  Extraocular movements intact with no nystagmus. Visual fields full. Facial sensation intact. No facial asymmetry. Tongue, uvula, palate midline.  Motor: Bulk and tone normal, muscle strength 5/5 throughout with no pronator drift.  Sensation to light touch intact.  No extinction to double simultaneous stimulation.  Deep tendon reflexes 2+ throughout, toes downgoing.  Finger to nose testing intact.  Gait narrow-based and steady, mild difficulty with tandem walk but able.  Romberg negative.  IMPRESSION: This is a pleasant 70 yo RH man with a history of multiple cancers, including cancer of prostate, bladder, neuroendocrine tumor in duodenum, melanoma, and most recently non-small cell lung cancer with metastases to the lymph nodes, who had an amnestic episode that occurred on awakening last 08/02/15. The etiology of his symptoms is unclear, MRI brain and EEG were unremarkable. No further similar symptoms since October 2016, we discussed close clinical monitoring, if symptoms recur, further testing with ambulatory EEG will be done. He is agreeable to this as he will be starting cancer treatments this month. [place] driving laws were again discussed with the patient, and he knows to stop driving after an episode of loss of consciousness/awareness, until 6 months event-free. He will follow-up in 3 months and knows to call for any changes.   Thank you for allowing me to participate in his care.  Please do not hesitate to call for any questions or concerns.  The duration of this appointment visit was 15 minutes of face-to-face time with the patient.  Greater than 50% of this time was spent in counseling, explanation of diagnosis, planning of further management, and coordination of care.   Ellouise Newer, M.D.

## 2015-10-17 NOTE — Procedures (Signed)
ELECTROENCEPHALOGRAM REPORT  Date of Study: 10/10/2015  Patient's Name: Scott Bird MRN: [id number] Date of Birth: Dec 21, 1945  Referring Provider: Dr. Ellouise Newer  Clinical History: This is a 70 year old man with an amnestic episode last October 2016.  Medications: Lipitor, Tricor, Prinzide, Zestoretic, Lialdia, Event organiser Summary: A multichannel digital EEG recording measured by the international 10-20 system with electrodes applied with paste and impedances below 5000 ohms performed in our laboratory with EKG monitoring in an awake and asleep patient.  Hyperventilation was not performed. Photic stimulation was performed.  The digital EEG was referentially recorded, reformatted, and digitally filtered in a variety of bipolar and referential montages for optimal display.    Description: The patient is awake and asleep during the recording.  During maximal wakefulness, there is a symmetric, medium voltage 8 Hz posterior dominant rhythm that attenuates with eye opening.  The record is symmetric.  During drowsiness and sleep, there is an increase in theta slowing of the background, with shifting asymmetry over the bilateral temporal regions. Vertex waves and symmetric sleep spindles were seen.  Photic stimulation did not elicit any abnormalities.  There were no epileptiform discharges or electrographic seizures seen.    EKG lead was unremarkable.  Impression: This awake and asleep EEG is within normal limits for age.  Clinical Correlation: A normal EEG does not exclude a clinical diagnosis of epilepsy.  If further clinical questions remain, prolonged EEG may be helpful.  Clinical correlation is advised.   Ellouise Newer, M.D.

## 2015-10-17 NOTE — Patient Instructions (Signed)
1. Follow-up in 3 months, call for any change in symptoms 2. As per [place] driving laws, no driving after an episode of loss of awareness, until 6 months event-free

## 2015-10-23 DIAGNOSIS — M47812 Spondylosis without myelopathy or radiculopathy, cervical region: Secondary | ICD-10-CM | POA: Diagnosis not present

## 2015-10-23 DIAGNOSIS — R599 Enlarged lymph nodes, unspecified: Secondary | ICD-10-CM | POA: Diagnosis not present

## 2015-10-23 DIAGNOSIS — C779 Secondary and unspecified malignant neoplasm of lymph node, unspecified: Secondary | ICD-10-CM | POA: Diagnosis not present

## 2015-10-23 DIAGNOSIS — C3411 Malignant neoplasm of upper lobe, right bronchus or lung: Secondary | ICD-10-CM | POA: Diagnosis not present

## 2015-10-23 DIAGNOSIS — C3490 Malignant neoplasm of unspecified part of unspecified bronchus or lung: Secondary | ICD-10-CM | POA: Diagnosis not present

## 2015-10-31 ENCOUNTER — Encounter: Payer: Self-pay | Admitting: Family Medicine

## 2015-10-31 ENCOUNTER — Encounter (INDEPENDENT_AMBULATORY_CARE_PROVIDER_SITE_OTHER): Payer: Self-pay

## 2015-10-31 ENCOUNTER — Ambulatory Visit (INDEPENDENT_AMBULATORY_CARE_PROVIDER_SITE_OTHER): Payer: Medicare Other | Admitting: Family Medicine

## 2015-10-31 ENCOUNTER — Ambulatory Visit (INDEPENDENT_AMBULATORY_CARE_PROVIDER_SITE_OTHER): Payer: Medicare Other

## 2015-10-31 VITALS — BP 149/73 | HR 76 | Temp 99.4°F | Ht 69.0 in | Wt 251.6 lb

## 2015-10-31 DIAGNOSIS — R05 Cough: Secondary | ICD-10-CM | POA: Diagnosis not present

## 2015-10-31 DIAGNOSIS — R059 Cough, unspecified: Secondary | ICD-10-CM

## 2015-10-31 MED ORDER — HYDROCODONE-HOMATROPINE 5-1.5 MG/5ML PO SYRP: 5.0000 mL | ORAL_SOLUTION | Freq: Three times a day (TID) | ORAL | Status: DC | PRN

## 2015-10-31 MED ORDER — AZITHROMYCIN 250 MG PO TABS: ORAL_TABLET | ORAL | Status: DC

## 2015-10-31 NOTE — Progress Notes (Signed)
   Subjective:    Patient ID: Scott Bird, male    DOB: November 04, 1945, 70 y.o.   MRN: [id number]  HPI 70 year old gentleman who developed cough last night. He has a history of lung cancer that was treated and has recurred. He is now under consideration for experimental protocol by history. The cough began fairly suddenly last night. There is no fever or chills. He was unable to rest last night secondary to the cough during his visit here today I would have to say I did not hear him cough any. There is been no chest pain. He has an albuterol inhaler that he used last night for the wheezing but in observing him using a today he needs some patient education to use it correctly. We accomplish that I hope.  Patient Active Problem List   Diagnosis Date Noted  . Transient alteration of awareness 09/19/2015  . History of cancer 09/19/2015  . CKD (chronic kidney disease) stage 3, GFR 30-59 ml/min 08/21/2015  . Lung cancer ([place]) 08/20/2015  . H/O prostate cancer 01/19/2014  . Hx of bladder cancer 01/19/2014  . 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 Other malaise and fatigue 01/19/2014  . Hypertension 06/30/2013  . Hyperlipidemia 06/30/2013  . GERD (gastroesophageal reflux disease) 06/30/2013  . Ulcerative colitis ([place]) 06/30/2013   Outpatient Encounter Prescriptions as of 10/31/2015  Medication Sig  . albuterol (PROVENTIL HFA;VENTOLIN HFA) 108 (90 BASE) MCG/ACT inhaler Inhale 2 puffs into the lungs every 6 (six) hours as needed for wheezing or shortness of breath.  Marland Kitchen atorvastatin (LIPITOR) 40 MG tablet Take 1 tablet (40 mg total) by mouth daily.  . fenofibrate (TRICOR) 145 MG tablet Take 1 tablet (145 mg total) by mouth daily.  Marland Kitchen lisinopril-hydrochlorothiazide (PRINZIDE,ZESTORETIC) 20-12.5 MG tablet TAKE ONE TABLET BY MOUTH ONCE DAILY  . mesalamine (LIALDA) 1.2 G EC tablet Take 1 tablet (1.2 g total) by mouth daily with breakfast.  . omeprazole (PRILOSEC) 40 MG capsule Take 1 capsule (40 mg  total) by mouth daily.   No facility-administered encounter medications on file as of 10/31/2015.      Review of Systems  Constitutional: Negative.   HENT: Negative.   Respiratory: Positive for cough.   Cardiovascular: Negative.   Neurological: Negative.   Psychiatric/Behavioral: Negative.        Objective:   Physical Exam  Constitutional: He appears well-developed and well-nourished.  Pulmonary/Chest: Effort normal.  Her are rhonchi on the right side of the chest and some wheezes.          Assessment & Plan:  1. Cough X-ray shows aforementioned lung cancer. There is no significant difference on the plain film but I understand that the CT scan which is more sensitive did show some progression. There is some peribronchial thickening consistent with bronchitis and exam and history suggest that also. Will treat with Zithromax Mucinex to help loosen cough and Hycodan as a nighttime cough suppressant. With history of lung cancer I cannot rule out a contribution from that but hopefully this will respond to the above treatment.  Wardell Honour MD - DG Chest 2 View; Future

## 2015-11-02 ENCOUNTER — Other Ambulatory Visit: Payer: Self-pay | Admitting: Nurse Practitioner

## 2015-11-02 ENCOUNTER — Telehealth: Payer: Self-pay

## 2015-11-02 DIAGNOSIS — C7801 Secondary malignant neoplasm of right lung: Secondary | ICD-10-CM

## 2015-11-02 NOTE — Telephone Encounter (Signed)
Wants second opinion from cancer dr. Lynnda Child like to see [facility] please

## 2015-11-06 DIAGNOSIS — Z8551 Personal history of malignant neoplasm of bladder: Secondary | ICD-10-CM | POA: Diagnosis not present

## 2015-11-06 DIAGNOSIS — Z85828 Personal history of other malignant neoplasm of skin: Secondary | ICD-10-CM | POA: Diagnosis not present

## 2015-11-06 DIAGNOSIS — R05 Cough: Secondary | ICD-10-CM | POA: Diagnosis not present

## 2015-11-06 DIAGNOSIS — Z87891 Personal history of nicotine dependence: Secondary | ICD-10-CM | POA: Diagnosis not present

## 2015-11-06 DIAGNOSIS — Z8546 Personal history of malignant neoplasm of prostate: Secondary | ICD-10-CM | POA: Diagnosis not present

## 2015-11-06 DIAGNOSIS — K219 Gastro-esophageal reflux disease without esophagitis: Secondary | ICD-10-CM | POA: Diagnosis not present

## 2015-11-06 DIAGNOSIS — Z8719 Personal history of other diseases of the digestive system: Secondary | ICD-10-CM | POA: Diagnosis not present

## 2015-11-06 DIAGNOSIS — R0602 Shortness of breath: Secondary | ICD-10-CM | POA: Diagnosis not present

## 2015-11-06 DIAGNOSIS — C3411 Malignant neoplasm of upper lobe, right bronchus or lung: Secondary | ICD-10-CM | POA: Diagnosis not present

## 2015-11-08 ENCOUNTER — Telehealth: Payer: Self-pay | Admitting: *Deleted

## 2015-11-08 DIAGNOSIS — C3430 Malignant neoplasm of lower lobe, unspecified bronchus or lung: Secondary | ICD-10-CM

## 2015-11-08 NOTE — Telephone Encounter (Signed)
Oncology Nurse Navigator Documentation  Oncology Nurse Navigator Flowsheets 11/08/2015  Navigator Encounter Type Telephone;Introductory phone call  Treatment Phase Treatment/I received a referral on Scott Bird today.  I called to give an appt.  He would like a second opinion with Dr. Julien Nordmann.  I asked that he get a CD of his scans, he is going to [facility] today.  I will let managed care team know to obtain records for Dr. Julien Nordmann. He verbalized understanding of appt time and place. 11/26/15 arrive at 1:45  Barriers/Navigation Needs Coordination of Care  Interventions Coordination of Care  Coordination of Care Appts  Acuity Level 2  Time Spent with Patient 15

## 2015-11-13 DIAGNOSIS — Z5111 Encounter for antineoplastic chemotherapy: Secondary | ICD-10-CM | POA: Diagnosis not present

## 2015-11-13 DIAGNOSIS — C3411 Malignant neoplasm of upper lobe, right bronchus or lung: Secondary | ICD-10-CM | POA: Diagnosis not present

## 2015-11-17 ENCOUNTER — Emergency Department (HOSPITAL_COMMUNITY)
Admission: EM | Admit: 2015-11-17 | Discharge: 2015-11-18 | Disposition: A | Discharge status: Home / Self Care | Payer: Medicare Other | Attending: Emergency Medicine | Admitting: Emergency Medicine

## 2015-11-17 ENCOUNTER — Encounter (HOSPITAL_COMMUNITY): Payer: Self-pay

## 2015-11-17 DIAGNOSIS — R509 Fever, unspecified: Secondary | ICD-10-CM | POA: Insufficient documentation

## 2015-11-17 DIAGNOSIS — R110 Nausea: Secondary | ICD-10-CM | POA: Insufficient documentation

## 2015-11-17 DIAGNOSIS — Z8669 Personal history of other diseases of the nervous system and sense organs: Secondary | ICD-10-CM | POA: Insufficient documentation

## 2015-11-17 DIAGNOSIS — K458 Other specified abdominal hernia without obstruction or gangrene: Secondary | ICD-10-CM | POA: Insufficient documentation

## 2015-11-17 DIAGNOSIS — J449 Chronic obstructive pulmonary disease, unspecified: Secondary | ICD-10-CM | POA: Insufficient documentation

## 2015-11-17 DIAGNOSIS — Z79899 Other long term (current) drug therapy: Secondary | ICD-10-CM | POA: Insufficient documentation

## 2015-11-17 DIAGNOSIS — Z87891 Personal history of nicotine dependence: Secondary | ICD-10-CM | POA: Diagnosis not present

## 2015-11-17 DIAGNOSIS — N189 Chronic kidney disease, unspecified: Secondary | ICD-10-CM | POA: Insufficient documentation

## 2015-11-17 DIAGNOSIS — Z8546 Personal history of malignant neoplasm of prostate: Secondary | ICD-10-CM | POA: Diagnosis not present

## 2015-11-17 DIAGNOSIS — R05 Cough: Secondary | ICD-10-CM | POA: Insufficient documentation

## 2015-11-17 LAB — I-STAT CG4 LACTIC ACID, ED: Lactic Acid, Venous: 1.31 mmol/L (ref 0.5–2.0)

## 2015-11-17 NOTE — ED Notes (Signed)
I started taking chemo on Tuesday for lung cancer. They told me if I started running a fever to call them. Around 1645 it was 100.9. At 2030 it was 99.5. The last time it was 100.5, called [facility] and they said to come to the ER to get my blood counts checked. Took two tylenol around 1645 and took two more at 2115.

## 2015-11-18 ENCOUNTER — Emergency Department (HOSPITAL_COMMUNITY): Payer: Medicare Other

## 2015-11-18 DIAGNOSIS — R509 Fever, unspecified: Secondary | ICD-10-CM | POA: Diagnosis not present

## 2015-11-18 LAB — COMPREHENSIVE METABOLIC PANEL
ALT: 50 U/L (ref 17–63)
AST: 39 U/L (ref 15–41)
Albumin: 3.6 g/dL (ref 3.5–5.0)
Alkaline Phosphatase: 34 U/L — ABNORMAL LOW (ref 38–126)
Anion gap: 9 (ref 5–15)
BUN: 30 mg/dL — AB (ref 6–20)
CHLORIDE: 106 mmol/L (ref 101–111)
CO2: 24 mmol/L (ref 22–32)
CREATININE: 2.23 mg/dL — AB (ref 0.61–1.24)
Calcium: 8.4 mg/dL — ABNORMAL LOW (ref 8.9–10.3)
GFR calc Af Amer: 33 mL/min — ABNORMAL LOW (ref >60)
GFR calc non Af Amer: 28 mL/min — ABNORMAL LOW (ref >60)
Glucose, Bld: 145 mg/dL — ABNORMAL HIGH (ref 65–99)
Potassium: 3.7 mmol/L (ref 3.5–5.1)
SODIUM: 139 mmol/L (ref 135–145)
Total Bilirubin: 0.6 mg/dL (ref 0.3–1.2)
Total Protein: 6.6 g/dL (ref 6.5–8.1)

## 2015-11-18 LAB — URINALYSIS, ROUTINE W REFLEX MICROSCOPIC
Bilirubin Urine: NEGATIVE
Glucose, UA: NEGATIVE mg/dL
KETONES UR: NEGATIVE mg/dL
LEUKOCYTES UA: NEGATIVE
NITRITE: NEGATIVE
PROTEIN: 30 mg/dL — AB
Specific Gravity, Urine: 1.015 (ref 1.005–1.030)
pH: 6 (ref 5.0–8.0)

## 2015-11-18 LAB — CBC WITH DIFFERENTIAL/PLATELET
Basophils Absolute: 0 10*3/uL (ref 0.0–0.1)
Basophils Relative: 0 %
EOS ABS: 0.1 10*3/uL (ref 0.0–0.7)
EOS PCT: 2 %
HCT: 35.1 % — ABNORMAL LOW (ref 39.0–52.0)
HEMOGLOBIN: 11.8 g/dL — AB (ref 13.0–17.0)
LYMPHS ABS: 0.7 10*3/uL (ref 0.7–4.0)
Lymphocytes Relative: 9 %
MCH: 31.1 pg (ref 26.0–34.0)
MCHC: 33.6 g/dL (ref 30.0–36.0)
MCV: 92.6 fL (ref 78.0–100.0)
MONOS PCT: 2 %
Monocytes Absolute: 0.2 10*3/uL (ref 0.1–1.0)
Neutro Abs: 6.9 10*3/uL (ref 1.7–7.7)
Neutrophils Relative %: 87 %
PLATELETS: 151 10*3/uL (ref 150–400)
RBC: 3.79 MIL/uL — ABNORMAL LOW (ref 4.22–5.81)
RDW: 13.7 % (ref 11.5–15.5)
WBC: 7.9 10*3/uL (ref 4.0–10.5)

## 2015-11-18 LAB — URINE MICROSCOPIC-ADD ON

## 2015-11-18 MED ORDER — CEPHALEXIN 500 MG PO CAPS: 500.0000 mg | ORAL_CAPSULE | Freq: Two times a day (BID) | ORAL | Status: DC

## 2015-11-18 NOTE — ED Provider Notes (Signed)
CSN: [id number]     Arrival date & time 11/17/15  2244 History  By signing my name below, I, Dora Sims, attest that this documentation has been prepared under the direction and in the presence of Sherwood Gambler, MD . Electronically Signed: Dora Sims, Scribe. 11/18/2015. 12:32 AM.    Chief Complaint  Patient presents with  . Fever      The history is provided by the patient. No language interpreter was used.     HPI Comments: Scott Bird is a 70 y.o. male with h/o COPD, lung cancer, and chronic kidney disease who presents to the Emergency Department complaining of sudden onset fever beginning earlier tonight. Pt reports that he began chemotherapy treatment for lung cancer a few days ago and reports a fever of up to 100.9 tonight. Pt states that he has taken four tylenol with no relief. He states that he has a regular cough with clear sputum which is normal and unchanged. Pt reports experiencing diarrhea one day ago that is resolved. Pt also endorses that he has felt nauseous every day this week. He denies rhinorrhea, vomiting, headache, dysuria, or any other associated symptoms at this time. His oncologist is Dr. Vanita Panda at [facility]. He has a PSHx of vasectomy, tonsillectomy, prostatectomy, bladder removal, and eye surgery.    Past Medical History  Diagnosis Date  . Cataract   . COPD (chronic obstructive pulmonary disease) ([place])   . Chronic kidney disease   . Cancer [facility])     prostate, bladder,duodenum,lung, melanoma    Past Surgical History  Procedure Laterality Date  . Vasectomy    . Tonsillectomy    . Eye surgery Right     Catarct Extraction  . Prostatectomy    . Bladder removal     Family History  Problem Relation Age of Onset  . Cancer Father     colon, kidney  . Stroke Father   . Congestive Heart Failure Father   . Diabetes Mother   . Cancer Brother     melanoma   Social History  Substance Use Topics  . Smoking status: Former  Research scientist (life sciences)  . Smokeless tobacco: Never Used  . Alcohol Use: 0.0 oz/week    0 Standard drinks or equivalent per week     Comment: Occ    Review of Systems  Constitutional: Positive for fever.  HENT: Negative for rhinorrhea.   Respiratory: Positive for cough.   Gastrointestinal: Positive for nausea. Negative for vomiting.  Genitourinary: Negative for dysuria.  Neurological: Negative for headaches.  All other systems reviewed and are negative.     Allergies  Review of patient's allergies indicates no known allergies.  Home Medications   Prior to Admission medications   Medication Sig Start Date End Date Taking? Authorizing Provider  albuterol (PROVENTIL HFA;VENTOLIN HFA) 108 (90 BASE) MCG/ACT inhaler Inhale 2 puffs into the lungs every 6 (six) hours as needed for wheezing or shortness of breath. 04/24/15   Tiffany A Gann, PA-C  atorvastatin (LIPITOR) 40 MG tablet Take 1 tablet (40 mg total) by mouth daily. 08/21/15   Mary-Margaret Hassell Done, FNP  azithromycin (ZITHROMAX Z-PAK) 250 MG tablet Take 2 tablets first day then 1 tablet daily 4 10/31/15   Wardell Honour, MD  fenofibrate (TRICOR) 145 MG tablet Take 1 tablet (145 mg total) by mouth daily. 01/09/15   Mary-Margaret Hassell Done, FNP  HYDROcodone-homatropine (HYCODAN) 5-1.5 MG/5ML syrup Take 5 mLs by mouth every 8 (eight) hours as needed for cough. 10/31/15  Wardell Honour, MD  lisinopril-hydrochlorothiazide (PRINZIDE,ZESTORETIC) 20-12.5 MG tablet TAKE ONE TABLET BY MOUTH ONCE DAILY 09/17/15   Mary-Margaret Hassell Done, FNP  mesalamine (LIALDA) 1.2 G EC tablet Take 1 tablet (1.2 g total) by mouth daily with breakfast. 08/20/15   Mary-Margaret Hassell Done, FNP  omeprazole (PRILOSEC) 40 MG capsule Take 1 capsule (40 mg total) by mouth daily. 06/04/15   Mary-Margaret Hassell Done, FNP   BP 136/92 mmHg  Pulse 93  Temp(Src) 99.2 F (37.3 C)  Resp 20  Ht '5\' 9"'$  (1.753 m)  Wt 245 lb (111.131 kg)  BMI 36.16 kg/m2  SpO2 97% Physical Exam  Constitutional: He  is oriented to person, place, and time. He appears well-developed and well-nourished.  HENT:  Head: Normocephalic and atraumatic.  Right Ear: External ear normal.  Left Ear: External ear normal.  Nose: Nose normal.  Eyes: Right eye exhibits no discharge. Left eye exhibits no discharge.  Neck: Neck supple.  Cardiovascular: Normal rate, regular rhythm, normal heart sounds and intact distal pulses.   Pulmonary/Chest: Effort normal and breath sounds normal.  Abdominal: Soft. There is no tenderness.  LUQ hernia w/o tenderness, easily reducible  Musculoskeletal: He exhibits no edema.  Neurological: He is alert and oriented to person, place, and time.  Skin: Skin is warm and dry.  Nursing note and vitals reviewed.   ED Course  Procedures (including critical care time)  DIAGNOSTIC STUDIES: Oxygen Saturation is 97% on RA, normal by my interpretation.    COORDINATION OF CARE:  12:50 AM Will order CXR. Will order Urinalysis and blood work. Discussed treatment plan with pt at bedside and pt agreed to plan.   Labs Review Labs Reviewed  COMPREHENSIVE METABOLIC PANEL - Abnormal; Notable for the following:    Glucose, Bld 145 (*)    BUN 30 (*)    Creatinine, Ser 2.23 (*)    Calcium 8.4 (*)    Alkaline Phosphatase 34 (*)    GFR calc non Af Amer 28 (*)    GFR calc Af Amer 33 (*)    All other components within normal limits  CBC WITH DIFFERENTIAL/PLATELET - Abnormal; Notable for the following:    RBC 3.79 (*)    Hemoglobin 11.8 (*)    HCT 35.1 (*)    All other components within normal limits  URINALYSIS, ROUTINE W REFLEX MICROSCOPIC (NOT AT [facility]) - Abnormal; Notable for the following:    Hgb urine dipstick SMALL (*)    Protein, ur 30 (*)    All other components within normal limits  URINE MICROSCOPIC-ADD ON - Abnormal; Notable for the following:    Squamous Epithelial / LPF 0-5 (*)    Bacteria, UA MANY (*)    All other components within normal limits  CULTURE, BLOOD (ROUTINE X 2)   CULTURE, BLOOD (ROUTINE X 2)  URINE CULTURE  I-STAT CG4 LACTIC ACID, ED    Imaging Review Dg Chest 2 View  11/18/2015  CLINICAL DATA:  Fever, recently started chemotherapy. History of prostate cancer, lung cancer, bladder cancer. EXAM: CHEST  2 VIEW COMPARISON:  None. FINDINGS: Mild bronchitic changes. No pleural effusion or focal consolidation. Similar RIGHT hilar masslike density. Cardiomediastinal silhouette is normal. No pneumothorax. 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 No destructive bony lesions. IMPRESSION: Similar RIGHT hilar masslike density.  Mild bronchitic changes. Electronically Signed   By: Elon Alas M.D.   On: 11/18/2015 01:44   I have personally reviewed and evaluated these images and lab results as part of my medical decision-making.  EKG Interpretation None      MDM   Final diagnoses:  Fever in adult    Patient appears quite well and is not febrile now. X-ray shows no significant change with stable right hilar mass. No signs of pneumonia. Patient denies dysuria but does have a bacteriuria and hematuria in his urine. Discussed with oncology on call at [facility], Dr. Baltazar Najjar, who given the urine findings despite no symptoms recommends treatment for a possible UTI and otherwise discharge given well appearance and follow-up as scheduled in the next couple days with his oncologist. No neutropenia or alarming findings. Discussed plan with patient and family as well as return precautions including a recurrent fever.  I personally performed the services described in this documentation, which was scribed in my presence. The recorded information has been reviewed and is accurate.    Sherwood Gambler, MD 11/18/15 [phone]

## 2015-11-20 DIAGNOSIS — Z5111 Encounter for antineoplastic chemotherapy: Secondary | ICD-10-CM | POA: Diagnosis not present

## 2015-11-20 DIAGNOSIS — C3411 Malignant neoplasm of upper lobe, right bronchus or lung: Secondary | ICD-10-CM | POA: Diagnosis not present

## 2015-11-20 LAB — URINE CULTURE

## 2015-11-23 ENCOUNTER — Ambulatory Visit: Payer: Medicare Other | Admitting: Nurse Practitioner

## 2015-11-23 ENCOUNTER — Emergency Department (HOSPITAL_COMMUNITY): Payer: Medicare Other

## 2015-11-23 ENCOUNTER — Encounter (HOSPITAL_COMMUNITY): Payer: Self-pay

## 2015-11-23 ENCOUNTER — Emergency Department (HOSPITAL_COMMUNITY)
Admission: EM | Admit: 2015-11-23 | Discharge: 2015-11-23 | Disposition: A | Discharge status: Home / Self Care | Payer: Medicare Other | Attending: Emergency Medicine | Admitting: Emergency Medicine

## 2015-11-23 DIAGNOSIS — E860 Dehydration: Secondary | ICD-10-CM | POA: Insufficient documentation

## 2015-11-23 DIAGNOSIS — N189 Chronic kidney disease, unspecified: Secondary | ICD-10-CM | POA: Insufficient documentation

## 2015-11-23 DIAGNOSIS — Z8551 Personal history of malignant neoplasm of bladder: Secondary | ICD-10-CM | POA: Insufficient documentation

## 2015-11-23 DIAGNOSIS — Z8546 Personal history of malignant neoplasm of prostate: Secondary | ICD-10-CM | POA: Insufficient documentation

## 2015-11-23 DIAGNOSIS — R509 Fever, unspecified: Secondary | ICD-10-CM | POA: Diagnosis not present

## 2015-11-23 DIAGNOSIS — Z9849 Cataract extraction status, unspecified eye: Secondary | ICD-10-CM | POA: Insufficient documentation

## 2015-11-23 DIAGNOSIS — Z79899 Other long term (current) drug therapy: Secondary | ICD-10-CM | POA: Diagnosis not present

## 2015-11-23 DIAGNOSIS — J449 Chronic obstructive pulmonary disease, unspecified: Secondary | ICD-10-CM | POA: Insufficient documentation

## 2015-11-23 DIAGNOSIS — R531 Weakness: Secondary | ICD-10-CM | POA: Diagnosis not present

## 2015-11-23 DIAGNOSIS — Z87891 Personal history of nicotine dependence: Secondary | ICD-10-CM | POA: Insufficient documentation

## 2015-11-23 DIAGNOSIS — Z85118 Personal history of other malignant neoplasm of bronchus and lung: Secondary | ICD-10-CM | POA: Insufficient documentation

## 2015-11-23 DIAGNOSIS — Z85820 Personal history of malignant melanoma of skin: Secondary | ICD-10-CM | POA: Diagnosis not present

## 2015-11-23 DIAGNOSIS — Z85068 Personal history of other malignant neoplasm of small intestine: Secondary | ICD-10-CM | POA: Insufficient documentation

## 2015-11-23 DIAGNOSIS — Z792 Long term (current) use of antibiotics: Secondary | ICD-10-CM | POA: Diagnosis not present

## 2015-11-23 DIAGNOSIS — R05 Cough: Secondary | ICD-10-CM | POA: Diagnosis not present

## 2015-11-23 LAB — URINE MICROSCOPIC-ADD ON

## 2015-11-23 LAB — CULTURE, BLOOD (ROUTINE X 2)
CULTURE: NO GROWTH
Culture: NO GROWTH

## 2015-11-23 LAB — COMPREHENSIVE METABOLIC PANEL
ALK PHOS: 37 U/L — AB (ref 38–126)
ALT: 121 U/L — AB (ref 17–63)
AST: 61 U/L — ABNORMAL HIGH (ref 15–41)
Albumin: 3.4 g/dL — ABNORMAL LOW (ref 3.5–5.0)
Anion gap: 10 (ref 5–15)
BUN: 32 mg/dL — AB (ref 6–20)
CALCIUM: 8.6 mg/dL — AB (ref 8.9–10.3)
CO2: 22 mmol/L (ref 22–32)
CREATININE: 2.69 mg/dL — AB (ref 0.61–1.24)
Chloride: 103 mmol/L (ref 101–111)
GFR calc non Af Amer: 22 mL/min — ABNORMAL LOW (ref >60)
GFR, EST AFRICAN AMERICAN: 26 mL/min — AB (ref >60)
Glucose, Bld: 168 mg/dL — ABNORMAL HIGH (ref 65–99)
Potassium: 4 mmol/L (ref 3.5–5.1)
SODIUM: 135 mmol/L (ref 135–145)
Total Bilirubin: 0.8 mg/dL (ref 0.3–1.2)
Total Protein: 6.9 g/dL (ref 6.5–8.1)

## 2015-11-23 LAB — CBC WITH DIFFERENTIAL/PLATELET
BASOS PCT: 0 %
Basophils Absolute: 0 10*3/uL (ref 0.0–0.1)
EOS ABS: 0 10*3/uL (ref 0.0–0.7)
EOS PCT: 0 %
HCT: 30.8 % — ABNORMAL LOW (ref 39.0–52.0)
Hemoglobin: 10.4 g/dL — ABNORMAL LOW (ref 13.0–17.0)
Lymphocytes Relative: 1 %
Lymphs Abs: 0.1 10*3/uL — ABNORMAL LOW (ref 0.7–4.0)
MCH: 31 pg (ref 26.0–34.0)
MCHC: 33.8 g/dL (ref 30.0–36.0)
MCV: 91.7 fL (ref 78.0–100.0)
MONOS PCT: 0 %
Monocytes Absolute: 0 10*3/uL — ABNORMAL LOW (ref 0.1–1.0)
NEUTROS PCT: 99 %
Neutro Abs: 10.1 10*3/uL — ABNORMAL HIGH (ref 1.7–7.7)
PLATELETS: 45 10*3/uL — AB (ref 150–400)
RBC MORPHOLOGY: DECREASED
RBC: 3.36 MIL/uL — ABNORMAL LOW (ref 4.22–5.81)
RDW: 13.7 % (ref 11.5–15.5)
WBC: 10.2 10*3/uL (ref 4.0–10.5)

## 2015-11-23 LAB — URINALYSIS, ROUTINE W REFLEX MICROSCOPIC
Bilirubin Urine: NEGATIVE
GLUCOSE, UA: NEGATIVE mg/dL
KETONES UR: NEGATIVE mg/dL
LEUKOCYTES UA: NEGATIVE
NITRITE: NEGATIVE
PROTEIN: 30 mg/dL — AB
Specific Gravity, Urine: 1.01 (ref 1.005–1.030)
pH: 6 (ref 5.0–8.0)

## 2015-11-23 LAB — LACTIC ACID, PLASMA: Lactic Acid, Venous: 1 mmol/L (ref 0.5–2.0)

## 2015-11-23 MED ORDER — SODIUM CHLORIDE 0.9 % IV BOLUS (SEPSIS)
1000.0000 mL | Freq: Once | INTRAVENOUS | Status: AC
  Administered 2015-11-23: 1000 mL via INTRAVENOUS

## 2015-11-23 MED ORDER — LEVOFLOXACIN 250 MG PO TABS: 250.0000 mg | ORAL_TABLET | Freq: Every day | ORAL | Status: DC

## 2015-11-23 MED ORDER — LEVOFLOXACIN 500 MG PO TABS
500.0000 mg | ORAL_TABLET | Freq: Once | ORAL | Status: AC
  Administered 2015-11-23: 500 mg via ORAL
  Filled 2015-11-23: qty 1

## 2015-11-23 NOTE — ED Provider Notes (Signed)
CSN: [id number]     Arrival date & time 11/23/15  0008 History   First MD Initiated Contact with Patient 11/23/15 0058   Chief Complaint  Patient presents with  . 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 The first infusion was done on February 7. He states about 5-6 hours after the second chemotherapy he had extreme loss of energy and felt weak. He has had mild nausea and has been taking his nausea medicines as needed. However tonight he acutely got more nauseated and has had vomiting 4 and diarrhea 3. He denies any abdominal pain. He states he had fever to 102 this afternoon. 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 He denies sore throat and states his chronic cough is improving. He did take Tylenol around 8:30 or 6:30 PM tonight. Patient also reports he has been having balance problems and today it got worse. He states he normally feels like he's going to fall backwards.  [facility] [place] at [facility]  Past Medical History  Diagnosis Date  . Cataract   . COPD (chronic obstructive pulmonary disease) ([place])   . Chronic kidney disease   . Cancer [facility])     prostate, bladder,duodenum,lung, melanoma    Past Surgical History  Procedure Laterality Date  . Vasectomy    . Tonsillectomy    . Eye surgery Right     Catarct Extraction  . Prostatectomy    . Bladder removal     Family History  Problem Relation Age of Onset  . Cancer Father     colon, kidney  . Stroke Father   . Congestive Heart Failure Father   . Diabetes Mother   . Cancer Brother     melanoma   Social History  Substance Use Topics  . Smoking status: Former Research scientist (life sciences)  . Smokeless tobacco: Never Used  . Alcohol Use: 0.0 oz/week    0 Standard drinks or equivalent per  week     Comment: Occ  lives at home Lives with spouse Quit smoking 1 year ago  Review of Systems  All other systems reviewed and are negative.     Allergies  Review of patient's allergies indicates no known allergies.  Home Medications   Prior to Admission medications   Medication Sig Start Date End Date Taking? Authorizing Provider  albuterol (PROVENTIL HFA;VENTOLIN HFA) 108 (90 BASE) MCG/ACT inhaler Inhale 2 puffs into the lungs every 6 (six) hours as needed for wheezing or shortness of breath. 04/24/15  Yes Tiffany A Gann, PA-C  atorvastatin (LIPITOR) 40 MG tablet Take 1 tablet (40 mg total) by mouth daily. 08/21/15  Yes Mary-Margaret Hassell Done, FNP  cephALEXin (KEFLEX) 500 MG capsule Take 1 capsule (500 mg total) by mouth 2 (two) times daily. 11/18/15  Yes Sherwood Gambler, MD  hydrochlorothiazide (MICROZIDE) 12.5 MG capsule Take 12.5 mg by mouth daily.   Yes Historical Provider, MD  lisinopril-hydrochlorothiazide (PRINZIDE,ZESTORETIC) 20-12.5 MG tablet TAKE ONE TABLET BY MOUTH ONCE DAILY 09/17/15  Yes Mary-Margaret Hassell Done, FNP  mesalamine (LIALDA) 1.2 G EC tablet Take 1 tablet (1.2 g total) by mouth daily with breakfast. 08/20/15  Yes Mary-Margaret Hassell Done, FNP  omeprazole (PRILOSEC) 40 MG capsule Take 1 capsule (40 mg total) by mouth daily. 06/04/15  Yes Mary-Margaret Hassell Done, FNP  azithromycin (ZITHROMAX Z-PAK) 250 MG tablet Take  2 tablets first day then 1 tablet daily 4 10/31/15   Wardell Honour, MD  fenofibrate (TRICOR) 145 MG tablet Take 1 tablet (145 mg total) by mouth daily. 01/09/15   Mary-Margaret Hassell Done, FNP  HYDROcodone-homatropine (HYCODAN) 5-1.5 MG/5ML syrup Take 5 mLs by mouth every 8 (eight) hours as needed for cough. 10/31/15   Wardell Honour, MD  levofloxacin (LEVAQUIN) 250 MG tablet Take 1 tablet (250 mg total) by mouth daily. 11/23/15   Rolland Porter, MD   BP 112/48 mmHg  Pulse 91  Temp(Src) 98.8 F (37.1 C) (Oral)  Resp 18  Ht '5\' 9"'$  (1.753 m)  Wt 242 lb (109.77 kg)   BMI 35.72 kg/m2  SpO2 98%  Vital signs normal   Physical Exam  Constitutional: He is oriented to person, place, and time. He appears well-developed and well-nourished.  Non-toxic appearance. He does not appear ill. No distress.  HENT:  Head: Normocephalic and atraumatic.  Right Ear: External ear normal.  Left Ear: External ear normal.  Nose: Nose normal. No mucosal edema or rhinorrhea.  Mouth/Throat: Mucous membranes are normal. No dental abscesses or uvula swelling.  Mucus membranes dry  Eyes: Conjunctivae and EOM are normal. Pupils are equal, round, and reactive to light.  Neck: Normal range of motion and full passive range of motion without pain. Neck supple.  Cardiovascular: Normal rate, regular rhythm and normal heart sounds.  Exam reveals no gallop and no friction rub.   No murmur heard. Pulmonary/Chest: Effort normal and breath sounds normal. No respiratory distress. He has no wheezes. He has no rhonchi. He has no rales. He exhibits no tenderness and no crepitus.  Abdominal: Soft. Normal appearance and bowel sounds are normal. He exhibits no distension. There is no tenderness. There is no rebound and no guarding.  Patient has several surgical scars on his abdomen and he has a large ventral hernia when he strains.  Musculoskeletal: Normal range of motion. He exhibits no edema or tenderness.  Moves all extremities well.   Neurological: He is alert and oriented to person, place, and time. He has normal strength. No cranial nerve deficit.  Skin: Skin is warm, dry and intact. No rash noted. No erythema. No pallor.  Psychiatric: He has a normal mood and affect. His speech is normal and behavior is normal. His mood appears not anxious.  Nursing note and vitals reviewed.   ED Course  Procedures (including critical care time)  Medications  levofloxacin (LEVAQUIN) tablet 500 mg (not administered)  sodium chloride 0.9 % bolus 1,000 mL (0 mLs Intravenous Stopped 11/23/15 0349)  sodium  chloride 0.9 % bolus 1,000 mL (0 mLs Intravenous Stopped 11/23/15 0349)  sodium chloride 0.9 % bolus 1,000 mL (1,000 mLs Intravenous New Bag/Given 11/23/15 0350)   Patient was given IV fluids because he appeared dehydrated.  Recheck at 4:30 AM she states he's feeling better. We discussed his test results. Since there was no obvious source of his fever seen at chest x-ray was ordered although he states his cough is improving. Patient has an appointment at 8:30 this morning at [facility] with the oncologist. I'm going to speak to them tonight.  5:50 AM I talked to Dr. Mindi Junker, patient's oncologist at [facility]. He wants to stop the Keflex and put him on Levaquin. He states if he starts having bleeding he would need a platelet transfusion but for now he can be discharged home and they will recheck his blood work on Monday, February 20. However if he  should have any bleeding problems he should come to the emergency department to get a platelet transfusion. His office will call the patient later today with an appointment time for Monday.   Review of his care everywhere chart shows patient had MRI of his brain done on December 29 at [facility] that just showed some atrophy and no acute changes.  Labs Review Results for orders placed or performed during the hospital encounter of 11/23/15  Culture, blood (routine x 2)  Result Value Ref Range   Specimen Description BLOOD RIGHT ANTECUBITAL    Special Requests BOTTLES DRAWN AEROBIC AND ANAEROBIC 10CC EACH    Culture NO GROWTH < 12 HOURS    Report Status PENDING   Culture, blood (routine x 2)  Result Value Ref Range   Specimen Description BLOOD RIGHT ANTECUBITAL    Special Requests BOTTLES DRAWN AEROBIC AND ANAEROBIC 8CC EACH    Culture NO GROWTH < 12 HOURS    Report Status PENDING   Comprehensive metabolic panel  Result Value Ref Range   Sodium 135 135 - 145 mmol/L   Potassium 4.0 3.5 - 5.1 mmol/L   Chloride 103 101 - 111 mmol/L    CO2 22 22 - 32 mmol/L   Glucose, Bld 168 (H) 65 - 99 mg/dL   BUN 32 (H) 6 - 20 mg/dL   Creatinine, Ser 2.69 (H) 0.61 - 1.24 mg/dL   Calcium 8.6 (L) 8.9 - 10.3 mg/dL   Total Protein 6.9 6.5 - 8.1 g/dL   Albumin 3.4 (L) 3.5 - 5.0 g/dL   AST 61 (H) 15 - 41 U/L   ALT 121 (H) 17 - 63 U/L   Alkaline Phosphatase 37 (L) 38 - 126 U/L   Total Bilirubin 0.8 0.3 - 1.2 mg/dL   GFR calc non Af Amer 22 (L) >60 mL/min   GFR calc Af Amer 26 (L) >60 mL/min   Anion gap 10 5 - 15  CBC with Differential  Result Value Ref Range   WBC 10.2 4.0 - 10.5 K/uL   RBC 3.36 (L) 4.22 - 5.81 MIL/uL   Hemoglobin 10.4 (L) 13.0 - 17.0 g/dL   HCT 30.8 (L) 39.0 - 52.0 %   MCV 91.7 78.0 - 100.0 fL   MCH 31.0 26.0 - 34.0 pg   MCHC 33.8 30.0 - 36.0 g/dL   RDW 13.7 11.5 - 15.5 %   Platelets 45 (L) 150 - 400 K/uL   Neutrophils Relative % 99 %   Neutro Abs 10.1 (H) 1.7 - 7.7 K/uL   Lymphocytes Relative 1 %   Lymphs Abs 0.1 (L) 0.7 - 4.0 K/uL   Monocytes Relative 0 %   Monocytes Absolute 0.0 (L) 0.1 - 1.0 K/uL   Eosinophils Relative 0 %   Eosinophils Absolute 0.0 0.0 - 0.7 K/uL   Basophils Relative 0 %   Basophils Absolute 0.0 0.0 - 0.1 K/uL   RBC Morphology PLATELETS APPEAR DECREASED   Urinalysis, Routine w reflex microscopic  Result Value Ref Range   Color, Urine ORANGE (A) YELLOW   APPearance CLEAR CLEAR   Specific Gravity, Urine 1.010 1.005 - 1.030   pH 6.0 5.0 - 8.0   Glucose, UA NEGATIVE NEGATIVE mg/dL   Hgb urine dipstick SMALL (A) NEGATIVE   Bilirubin Urine NEGATIVE NEGATIVE   Ketones, ur NEGATIVE NEGATIVE mg/dL   Protein, ur 30 (A) NEGATIVE mg/dL   Nitrite NEGATIVE NEGATIVE   Leukocytes, UA NEGATIVE NEGATIVE  Urine microscopic-add on  Result Value Ref Range  Squamous Epithelial / LPF 6-30 (A) NONE SEEN   WBC, UA 0-5 0 - 5 WBC/hpf   RBC / HPF 6-30 0 - 5 RBC/hpf   Bacteria, UA MANY (A) NONE SEEN  Lactic acid, plasma  Result Value Ref Range   Lactic Acid, Venous 1.0 0.5 - 2.0 mmol/L   Laboratory  interpretation all normal except for elevation of LFTs since this weekend, elevated BUN consistent with dehydration, contaminated urine however he has a neobladder and it will always be contaminated, new thrombocytopenia, new mild anemia, but no neutropenia     Imaging Review Dg Chest 2 View  11/23/2015  CLINICAL DATA:  Vomiting and diarrhea today. Fever for several days. Chronic cough. EXAM: CHEST  2 VIEW COMPARISON:  11/18/2015 FINDINGS: Right hilar and suprahilar mass and scarring. This appears similar to previous study of mass in this region has been seen on previous CT from 01/19/2014. Mild hyperinflation, likely emphysema. Normal heart size and pulmonary vascularity. No focal consolidation or airspace disease. No blunting of costophrenic angles. No pneumothorax. Degenerative changes in the spine. IMPRESSION: Persistent appearance of mass and scarring in the right upper lung similar to previous study and likely representing known lesion. Emphysematous changes in the chest. No developing consolidation. Electronically Signed   By: Lucienne Capers M.D.   On: 11/23/2015 05:11   I have personally reviewed and evaluated these images and lab results as part of my medical decision-making.   EKG Interpretation None      MDM   Final diagnoses:  Fever, unspecified fever cause  Dehydration  Weakness    New Prescriptions   LEVOFLOXACIN (LEVAQUIN) 250 MG TABLET    Take 1 tablet (250 mg total) by mouth daily.    Plan discharge  Rolland Porter, MD, Barbette Or, MD 11/23/15 [phone]

## 2015-11-23 NOTE — Discharge Instructions (Signed)
Drink plenty of fluids. Take acetaminophen 1000 mg every 6-8 hours as needed for fever.  I talked to Dr. Lorenda Cahill, he will have his office call you later today for an appointment to be seen on Monday, February 20. You do not need to keep your appointment today at his office because I did the blood work he was going to do today. He wants you to start taking the Levaquin once a day. Stop the Keflex for now. Your platelet count is low, if you should start having bleeding problems including nosebleeds, bleeding from your rectum, your urine, or a cut on your skin that will not stop bleeding you would need to go to the emergency department and get a platelet transfusion. Any fall that you should have and you hit your head should be evaluated in an emergency department because of your low platelet count. Otherwise they will recheck your blood counts again on Monday. Return to the ED if you feel worse in any other way.

## 2015-11-23 NOTE — ED Notes (Signed)
Pt states he has had vomiting and diarrhea today, fever for several days.  Pt last took tylenol approx 6 pm.

## 2015-11-23 NOTE — ED Notes (Signed)
Pt states understanding of care given and follow up instructions.  Ambulated from ED with significant other

## 2015-11-26 ENCOUNTER — Ambulatory Visit: Payer: PRIVATE HEALTH INSURANCE | Admitting: Internal Medicine

## 2015-11-26 DIAGNOSIS — C3411 Malignant neoplasm of upper lobe, right bronchus or lung: Secondary | ICD-10-CM | POA: Diagnosis not present

## 2015-11-26 LAB — URINE CULTURE

## 2015-11-27 ENCOUNTER — Telehealth (HOSPITAL_BASED_OUTPATIENT_CLINIC_OR_DEPARTMENT_OTHER): Payer: Self-pay | Admitting: Emergency Medicine

## 2015-11-27 NOTE — Progress Notes (Signed)
ED Antimicrobial Stewardship Positive Culture Follow Up   Scott Bird is an 70 y.o. male who presented to [facility] on 11/23/2015 with a chief complaint of  Chief Complaint  Patient presents with  . Fever    Recent Results (from the past 720 hour(s))  Urine culture     Status: None   Collection Time: 11/17/15 11:28 PM  Result Value Ref Range Status   Specimen Description URINE, CLEAN CATCH  Final   Special Requests NONE  Final   Culture   Final    MULTIPLE SPECIES PRESENT, SUGGEST RECOLLECTION Performed at [facility]    Report Status 11/20/2015 FINAL  Final  Culture, blood (routine x 2)     Status: None   Collection Time: 11/17/15 11:29 PM  Result Value Ref Range Status   Specimen Description BLOOD RIGHT HAND DRAWN BY RN  Final   Special Requests BOTTLES DRAWN AEROBIC AND ANAEROBIC [place]  Final   Culture NO GROWTH 5 DAYS  Final   Report Status 11/23/2015 FINAL  Final  Culture, blood (routine x 2)     Status: None   Collection Time: 11/17/15 11:35 PM  Result Value Ref Range Status   Specimen Description BLOOD LEFT ANTECUBITAL  Final   Special Requests BOTTLES DRAWN AEROBIC AND ANAEROBIC 6CC  Final   Culture NO GROWTH 5 DAYS  Final   Report Status 11/23/2015 FINAL  Final  Culture, blood (routine x 2)     Status: None (Preliminary result)   Collection Time: 11/23/15  1:15 AM  Result Value Ref Range Status   Specimen Description BLOOD RIGHT ANTECUBITAL  Final   Special Requests BOTTLES DRAWN AEROBIC AND ANAEROBIC 10CC EACH  Final   Culture NO GROWTH 3 DAYS  Final   Report Status PENDING  Incomplete  Culture, blood (routine x 2)     Status: None (Preliminary result)   Collection Time: 11/23/15  1:30 AM  Result Value Ref Range Status   Specimen Description BLOOD RIGHT ANTECUBITAL  Final   Special Requests BOTTLES DRAWN AEROBIC AND ANAEROBIC [place]  Final   Culture NO GROWTH 3 DAYS  Final   Report Status PENDING  Incomplete  Urine culture     Status: None   Collection Time: 11/23/15  2:58 AM  Result Value Ref Range Status   Specimen Description URINE, CLEAN CATCH  Final   Special Requests NONE  Final   Culture   Final    >=100,000 COLONIES/mL PSEUDOMONAS AERUGINOSA Performed at [facility]    Report Status 11/26/2015 FINAL  Final   Organism ID, Bacteria PSEUDOMONAS AERUGINOSA  Final      Susceptibility   Pseudomonas aeruginosa - MIC*    CEFTAZIDIME 4 SENSITIVE Sensitive     CIPROFLOXACIN >=4 RESISTANT Resistant     GENTAMICIN 4 INTERMEDIATE Intermediate     IMIPENEM 1 SENSITIVE Sensitive     PIP/TAZO 16 SENSITIVE Sensitive     CEFEPIME 2 SENSITIVE Sensitive     * >=100,000 COLONIES/mL PSEUDOMONAS AERUGINOSA    '[x]'$  Treated with cipro, organism resistant to prescribed antimicrobial '[]'$  Patient discharged originally without antimicrobial agent and treatment is now indicated  New antibiotic prescription: will call results to dr. Mindi Junker oncologist at [place]  ED Provider: Arlean Hopping, PA-C  Wynell Balloon 11/27/2015, 8:14 AM Infectious Diseases Pharmacist Phone# [phone]

## 2015-11-27 NOTE — Telephone Encounter (Addendum)
Post ED Visit - Positive Culture Follow-up  Culture report reviewed by antimicrobial stewardship pharmacist:  '[]'$  Elenor Quinones, Pharm.D. '[]'$  Heide Guile, Pharm.D., BCPS '[x]'$  Parks Neptune, Pharm.D. '[]'$  Alycia Rossetti, Pharm.D., BCPS '[]'$  [place], [place].D., BCPS, AAHIVP '[]'$  Legrand Como, Pharm.D., BCPS, AAHIVP '[]'$  Milus Glazier, Pharm.D. '[]'$  Stephens November, Pharm.D.  Positive urine culture Psuedomonas Treated with levofloxacin, organism sensitive to the same and no further patient follow-up is required at this time. Results called to Dr. Mindi Junker oncologist @ [facility] per order, faxed results to [phone]  Scott Bird 11/27/2015, 11:03 AM

## 2015-11-28 LAB — CULTURE, BLOOD (ROUTINE X 2)
CULTURE: NO GROWTH
Culture: NO GROWTH

## 2015-12-04 DIAGNOSIS — D61811 Other drug-induced pancytopenia: Secondary | ICD-10-CM | POA: Diagnosis not present

## 2015-12-04 DIAGNOSIS — Z79899 Other long term (current) drug therapy: Secondary | ICD-10-CM | POA: Diagnosis not present

## 2015-12-04 DIAGNOSIS — I950 Idiopathic hypotension: Secondary | ICD-10-CM | POA: Diagnosis not present

## 2015-12-04 DIAGNOSIS — Z87891 Personal history of nicotine dependence: Secondary | ICD-10-CM | POA: Diagnosis not present

## 2015-12-04 DIAGNOSIS — C3411 Malignant neoplasm of upper lobe, right bronchus or lung: Secondary | ICD-10-CM | POA: Diagnosis not present

## 2015-12-04 DIAGNOSIS — N289 Disorder of kidney and ureter, unspecified: Secondary | ICD-10-CM | POA: Diagnosis not present

## 2015-12-04 DIAGNOSIS — I959 Hypotension, unspecified: Secondary | ICD-10-CM | POA: Diagnosis not present

## 2015-12-04 DIAGNOSIS — E785 Hyperlipidemia, unspecified: Secondary | ICD-10-CM | POA: Diagnosis not present

## 2015-12-04 DIAGNOSIS — N179 Acute kidney failure, unspecified: Secondary | ICD-10-CM | POA: Diagnosis not present

## 2015-12-04 DIAGNOSIS — N189 Chronic kidney disease, unspecified: Secondary | ICD-10-CM | POA: Diagnosis not present

## 2015-12-04 DIAGNOSIS — R5383 Other fatigue: Secondary | ICD-10-CM | POA: Diagnosis not present

## 2015-12-04 DIAGNOSIS — R638 Other symptoms and signs concerning food and fluid intake: Secondary | ICD-10-CM | POA: Diagnosis not present

## 2015-12-18 DIAGNOSIS — Z8546 Personal history of malignant neoplasm of prostate: Secondary | ICD-10-CM | POA: Diagnosis not present

## 2015-12-18 DIAGNOSIS — K5190 Ulcerative colitis, unspecified, without complications: Secondary | ICD-10-CM | POA: Diagnosis not present

## 2015-12-18 DIAGNOSIS — N189 Chronic kidney disease, unspecified: Secondary | ICD-10-CM | POA: Diagnosis not present

## 2015-12-18 DIAGNOSIS — Z8551 Personal history of malignant neoplasm of bladder: Secondary | ICD-10-CM | POA: Diagnosis not present

## 2015-12-18 DIAGNOSIS — D61810 Antineoplastic chemotherapy induced pancytopenia: Secondary | ICD-10-CM | POA: Diagnosis not present

## 2015-12-18 DIAGNOSIS — Z85828 Personal history of other malignant neoplasm of skin: Secondary | ICD-10-CM | POA: Diagnosis not present

## 2015-12-18 DIAGNOSIS — C3411 Malignant neoplasm of upper lobe, right bronchus or lung: Secondary | ICD-10-CM | POA: Diagnosis not present

## 2015-12-18 DIAGNOSIS — Z85820 Personal history of malignant melanoma of skin: Secondary | ICD-10-CM | POA: Diagnosis not present

## 2015-12-18 DIAGNOSIS — Z87891 Personal history of nicotine dependence: Secondary | ICD-10-CM | POA: Diagnosis not present

## 2015-12-18 DIAGNOSIS — Z6835 Body mass index (BMI) 35.0-35.9, adult: Secondary | ICD-10-CM | POA: Diagnosis not present

## 2015-12-20 ENCOUNTER — Other Ambulatory Visit: Payer: Self-pay | Admitting: Nurse Practitioner

## 2015-12-20 MED ORDER — OMEPRAZOLE 40 MG PO CPDR: 40.0000 mg | DELAYED_RELEASE_CAPSULE | Freq: Every day | ORAL | Status: DC

## 2015-12-25 DIAGNOSIS — Z8589 Personal history of malignant neoplasm of other organs and systems: Secondary | ICD-10-CM | POA: Diagnosis not present

## 2015-12-25 DIAGNOSIS — K5190 Ulcerative colitis, unspecified, without complications: Secondary | ICD-10-CM | POA: Diagnosis not present

## 2015-12-25 DIAGNOSIS — C3411 Malignant neoplasm of upper lobe, right bronchus or lung: Secondary | ICD-10-CM | POA: Diagnosis not present

## 2015-12-25 DIAGNOSIS — Z85828 Personal history of other malignant neoplasm of skin: Secondary | ICD-10-CM | POA: Diagnosis not present

## 2015-12-25 DIAGNOSIS — Z8551 Personal history of malignant neoplasm of bladder: Secondary | ICD-10-CM | POA: Diagnosis not present

## 2015-12-25 DIAGNOSIS — N189 Chronic kidney disease, unspecified: Secondary | ICD-10-CM | POA: Diagnosis not present

## 2015-12-25 DIAGNOSIS — Z87891 Personal history of nicotine dependence: Secondary | ICD-10-CM | POA: Diagnosis not present

## 2015-12-25 DIAGNOSIS — Z85820 Personal history of malignant melanoma of skin: Secondary | ICD-10-CM | POA: Diagnosis not present

## 2015-12-29 ENCOUNTER — Other Ambulatory Visit: Payer: Self-pay | Admitting: Nurse Practitioner

## 2015-12-29 DIAGNOSIS — J411 Mucopurulent chronic bronchitis: Secondary | ICD-10-CM

## 2015-12-29 MED ORDER — ALBUTEROL SULFATE (2.5 MG/3ML) 0.083% IN NEBU: 2.5000 mg | INHALATION_SOLUTION | Freq: Four times a day (QID) | RESPIRATORY_TRACT | Status: DC | PRN

## 2016-01-22 ENCOUNTER — Ambulatory Visit: Payer: PRIVATE HEALTH INSURANCE | Admitting: Neurology

## 2016-01-22 DIAGNOSIS — I129 Hypertensive chronic kidney disease with stage 1 through stage 4 chronic kidney disease, or unspecified chronic kidney disease: Secondary | ICD-10-CM | POA: Diagnosis not present

## 2016-01-22 DIAGNOSIS — Z87891 Personal history of nicotine dependence: Secondary | ICD-10-CM | POA: Diagnosis not present

## 2016-01-22 DIAGNOSIS — R0609 Other forms of dyspnea: Secondary | ICD-10-CM | POA: Diagnosis not present

## 2016-01-22 DIAGNOSIS — C3411 Malignant neoplasm of upper lobe, right bronchus or lung: Secondary | ICD-10-CM | POA: Diagnosis not present

## 2016-01-22 DIAGNOSIS — C3490 Malignant neoplasm of unspecified part of unspecified bronchus or lung: Secondary | ICD-10-CM | POA: Diagnosis not present

## 2016-01-22 DIAGNOSIS — N189 Chronic kidney disease, unspecified: Secondary | ICD-10-CM | POA: Diagnosis not present

## 2016-01-22 DIAGNOSIS — R05 Cough: Secondary | ICD-10-CM | POA: Diagnosis not present

## 2016-02-18 DIAGNOSIS — R590 Localized enlarged lymph nodes: Secondary | ICD-10-CM | POA: Diagnosis not present

## 2016-02-18 DIAGNOSIS — R918 Other nonspecific abnormal finding of lung field: Secondary | ICD-10-CM | POA: Diagnosis not present

## 2016-02-18 DIAGNOSIS — E041 Nontoxic single thyroid nodule: Secondary | ICD-10-CM | POA: Diagnosis not present

## 2016-02-18 DIAGNOSIS — C3490 Malignant neoplasm of unspecified part of unspecified bronchus or lung: Secondary | ICD-10-CM | POA: Diagnosis not present

## 2016-02-18 DIAGNOSIS — C3411 Malignant neoplasm of upper lobe, right bronchus or lung: Secondary | ICD-10-CM | POA: Diagnosis not present

## 2016-02-19 ENCOUNTER — Other Ambulatory Visit: Payer: Self-pay | Admitting: *Deleted

## 2016-02-19 DIAGNOSIS — Z9221 Personal history of antineoplastic chemotherapy: Secondary | ICD-10-CM | POA: Diagnosis not present

## 2016-02-19 DIAGNOSIS — F17200 Nicotine dependence, unspecified, uncomplicated: Secondary | ICD-10-CM | POA: Diagnosis not present

## 2016-02-19 DIAGNOSIS — N189 Chronic kidney disease, unspecified: Secondary | ICD-10-CM | POA: Diagnosis not present

## 2016-02-19 DIAGNOSIS — C3411 Malignant neoplasm of upper lobe, right bronchus or lung: Secondary | ICD-10-CM | POA: Diagnosis not present

## 2016-02-19 DIAGNOSIS — E785 Hyperlipidemia, unspecified: Secondary | ICD-10-CM | POA: Diagnosis not present

## 2016-02-19 DIAGNOSIS — R0609 Other forms of dyspnea: Secondary | ICD-10-CM | POA: Diagnosis not present

## 2016-02-19 DIAGNOSIS — Z79899 Other long term (current) drug therapy: Secondary | ICD-10-CM | POA: Diagnosis not present

## 2016-02-19 DIAGNOSIS — I129 Hypertensive chronic kidney disease with stage 1 through stage 4 chronic kidney disease, or unspecified chronic kidney disease: Secondary | ICD-10-CM | POA: Diagnosis not present

## 2016-02-19 DIAGNOSIS — R05 Cough: Secondary | ICD-10-CM | POA: Diagnosis not present

## 2016-02-19 MED ORDER — ATORVASTATIN CALCIUM 40 MG PO TABS: 40.0000 mg | ORAL_TABLET | Freq: Every day | ORAL | Status: DC

## 2016-03-11 ENCOUNTER — Other Ambulatory Visit: Payer: Self-pay | Admitting: *Deleted

## 2016-03-11 DIAGNOSIS — C3411 Malignant neoplasm of upper lobe, right bronchus or lung: Secondary | ICD-10-CM

## 2016-03-11 MED ORDER — ALBUTEROL SULFATE (2.5 MG/3ML) 0.083% IN NEBU: 2.5000 mg | INHALATION_SOLUTION | Freq: Four times a day (QID) | RESPIRATORY_TRACT | Status: AC | PRN

## 2016-03-11 MED ORDER — ALBUTEROL SULFATE (2.5 MG/3ML) 0.083% IN NEBU: 2.5000 mg | INHALATION_SOLUTION | Freq: Four times a day (QID) | RESPIRATORY_TRACT | Status: DC | PRN

## 2016-03-18 DIAGNOSIS — Z85820 Personal history of malignant melanoma of skin: Secondary | ICD-10-CM | POA: Diagnosis not present

## 2016-03-18 DIAGNOSIS — E785 Hyperlipidemia, unspecified: Secondary | ICD-10-CM | POA: Diagnosis not present

## 2016-03-18 DIAGNOSIS — C3490 Malignant neoplasm of unspecified part of unspecified bronchus or lung: Secondary | ICD-10-CM | POA: Diagnosis not present

## 2016-03-18 DIAGNOSIS — Z87891 Personal history of nicotine dependence: Secondary | ICD-10-CM | POA: Diagnosis not present

## 2016-03-18 DIAGNOSIS — C3411 Malignant neoplasm of upper lobe, right bronchus or lung: Secondary | ICD-10-CM | POA: Diagnosis not present

## 2016-03-18 DIAGNOSIS — Z8551 Personal history of malignant neoplasm of bladder: Secondary | ICD-10-CM | POA: Diagnosis not present

## 2016-03-18 DIAGNOSIS — Z9221 Personal history of antineoplastic chemotherapy: Secondary | ICD-10-CM | POA: Diagnosis not present

## 2016-03-18 DIAGNOSIS — Z8546 Personal history of malignant neoplasm of prostate: Secondary | ICD-10-CM | POA: Diagnosis not present

## 2016-03-18 DIAGNOSIS — Z79899 Other long term (current) drug therapy: Secondary | ICD-10-CM | POA: Diagnosis not present

## 2016-03-18 DIAGNOSIS — B349 Viral infection, unspecified: Secondary | ICD-10-CM | POA: Diagnosis not present

## 2016-03-25 DIAGNOSIS — F17210 Nicotine dependence, cigarettes, uncomplicated: Secondary | ICD-10-CM | POA: Diagnosis not present

## 2016-03-25 DIAGNOSIS — C3490 Malignant neoplasm of unspecified part of unspecified bronchus or lung: Secondary | ICD-10-CM | POA: Diagnosis not present

## 2016-03-25 DIAGNOSIS — Z8546 Personal history of malignant neoplasm of prostate: Secondary | ICD-10-CM | POA: Diagnosis not present

## 2016-03-25 DIAGNOSIS — N289 Disorder of kidney and ureter, unspecified: Secondary | ICD-10-CM | POA: Diagnosis not present

## 2016-03-25 DIAGNOSIS — Z6836 Body mass index (BMI) 36.0-36.9, adult: Secondary | ICD-10-CM | POA: Diagnosis not present

## 2016-03-25 DIAGNOSIS — Z8551 Personal history of malignant neoplasm of bladder: Secondary | ICD-10-CM | POA: Diagnosis not present

## 2016-03-25 DIAGNOSIS — Z85820 Personal history of malignant melanoma of skin: Secondary | ICD-10-CM | POA: Diagnosis not present

## 2016-03-25 DIAGNOSIS — Z85828 Personal history of other malignant neoplasm of skin: Secondary | ICD-10-CM | POA: Diagnosis not present

## 2016-04-22 DIAGNOSIS — Z906 Acquired absence of other parts of urinary tract: Secondary | ICD-10-CM | POA: Diagnosis not present

## 2016-04-22 DIAGNOSIS — Z85820 Personal history of malignant melanoma of skin: Secondary | ICD-10-CM | POA: Diagnosis not present

## 2016-04-22 DIAGNOSIS — C3411 Malignant neoplasm of upper lobe, right bronchus or lung: Secondary | ICD-10-CM | POA: Diagnosis not present

## 2016-04-22 DIAGNOSIS — N289 Disorder of kidney and ureter, unspecified: Secondary | ICD-10-CM | POA: Diagnosis not present

## 2016-04-22 DIAGNOSIS — Z9079 Acquired absence of other genital organ(s): Secondary | ICD-10-CM | POA: Diagnosis not present

## 2016-04-22 DIAGNOSIS — D696 Thrombocytopenia, unspecified: Secondary | ICD-10-CM | POA: Diagnosis not present

## 2016-04-22 DIAGNOSIS — E785 Hyperlipidemia, unspecified: Secondary | ICD-10-CM | POA: Diagnosis not present

## 2016-04-22 DIAGNOSIS — Z9889 Other specified postprocedural states: Secondary | ICD-10-CM | POA: Diagnosis not present

## 2016-04-22 DIAGNOSIS — Z79899 Other long term (current) drug therapy: Secondary | ICD-10-CM | POA: Diagnosis not present

## 2016-04-22 DIAGNOSIS — Z8551 Personal history of malignant neoplasm of bladder: Secondary | ICD-10-CM | POA: Diagnosis not present

## 2016-04-25 ENCOUNTER — Other Ambulatory Visit: Payer: Self-pay | Admitting: Nurse Practitioner

## 2016-04-29 DIAGNOSIS — C3411 Malignant neoplasm of upper lobe, right bronchus or lung: Secondary | ICD-10-CM | POA: Diagnosis not present

## 2016-05-09 ENCOUNTER — Other Ambulatory Visit: Payer: Self-pay | Admitting: Nurse Practitioner

## 2016-05-09 ENCOUNTER — Telehealth: Payer: Self-pay

## 2016-05-09 DIAGNOSIS — L255 Unspecified contact dermatitis due to plants, except food: Secondary | ICD-10-CM

## 2016-05-09 MED ORDER — PREDNISONE 10 MG (21) PO TBPK: ORAL_TABLET | ORAL | 0 refills | Status: DC

## 2016-05-09 NOTE — Telephone Encounter (Signed)
prednisone rx sent to pharmacy

## 2016-05-09 NOTE — Telephone Encounter (Signed)
Patient has poison oak all over his face. Can meds be sent in? Please advise

## 2016-05-15 IMAGING — DX DG CHEST 2V
2 series · 2 of 2 positions shown · non-contrast
Comparison: None.

CLINICAL DATA: Fever, recently started chemotherapy. History of
prostate cancer, lung cancer, bladder cancer.

EXAM:
CHEST  2 VIEW

[chest pa]
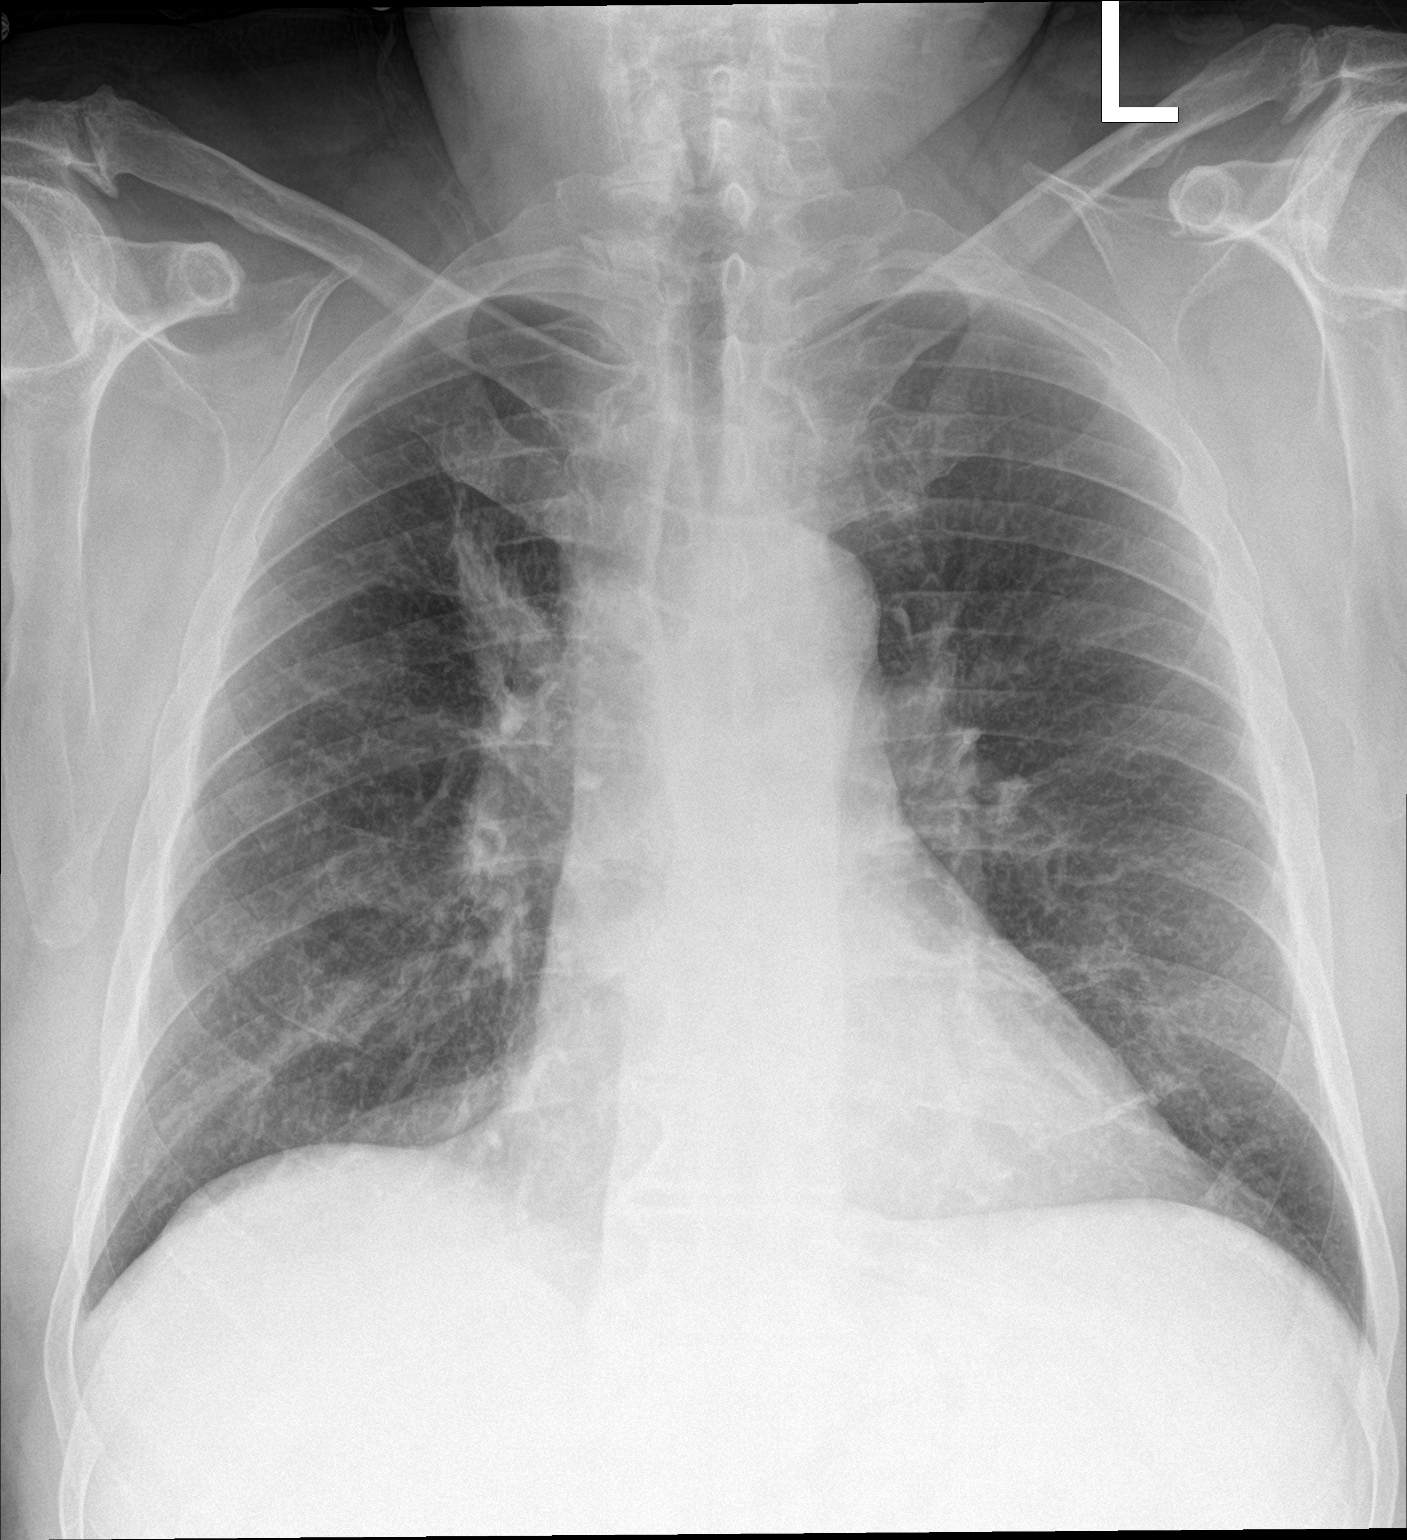

[chest lat]
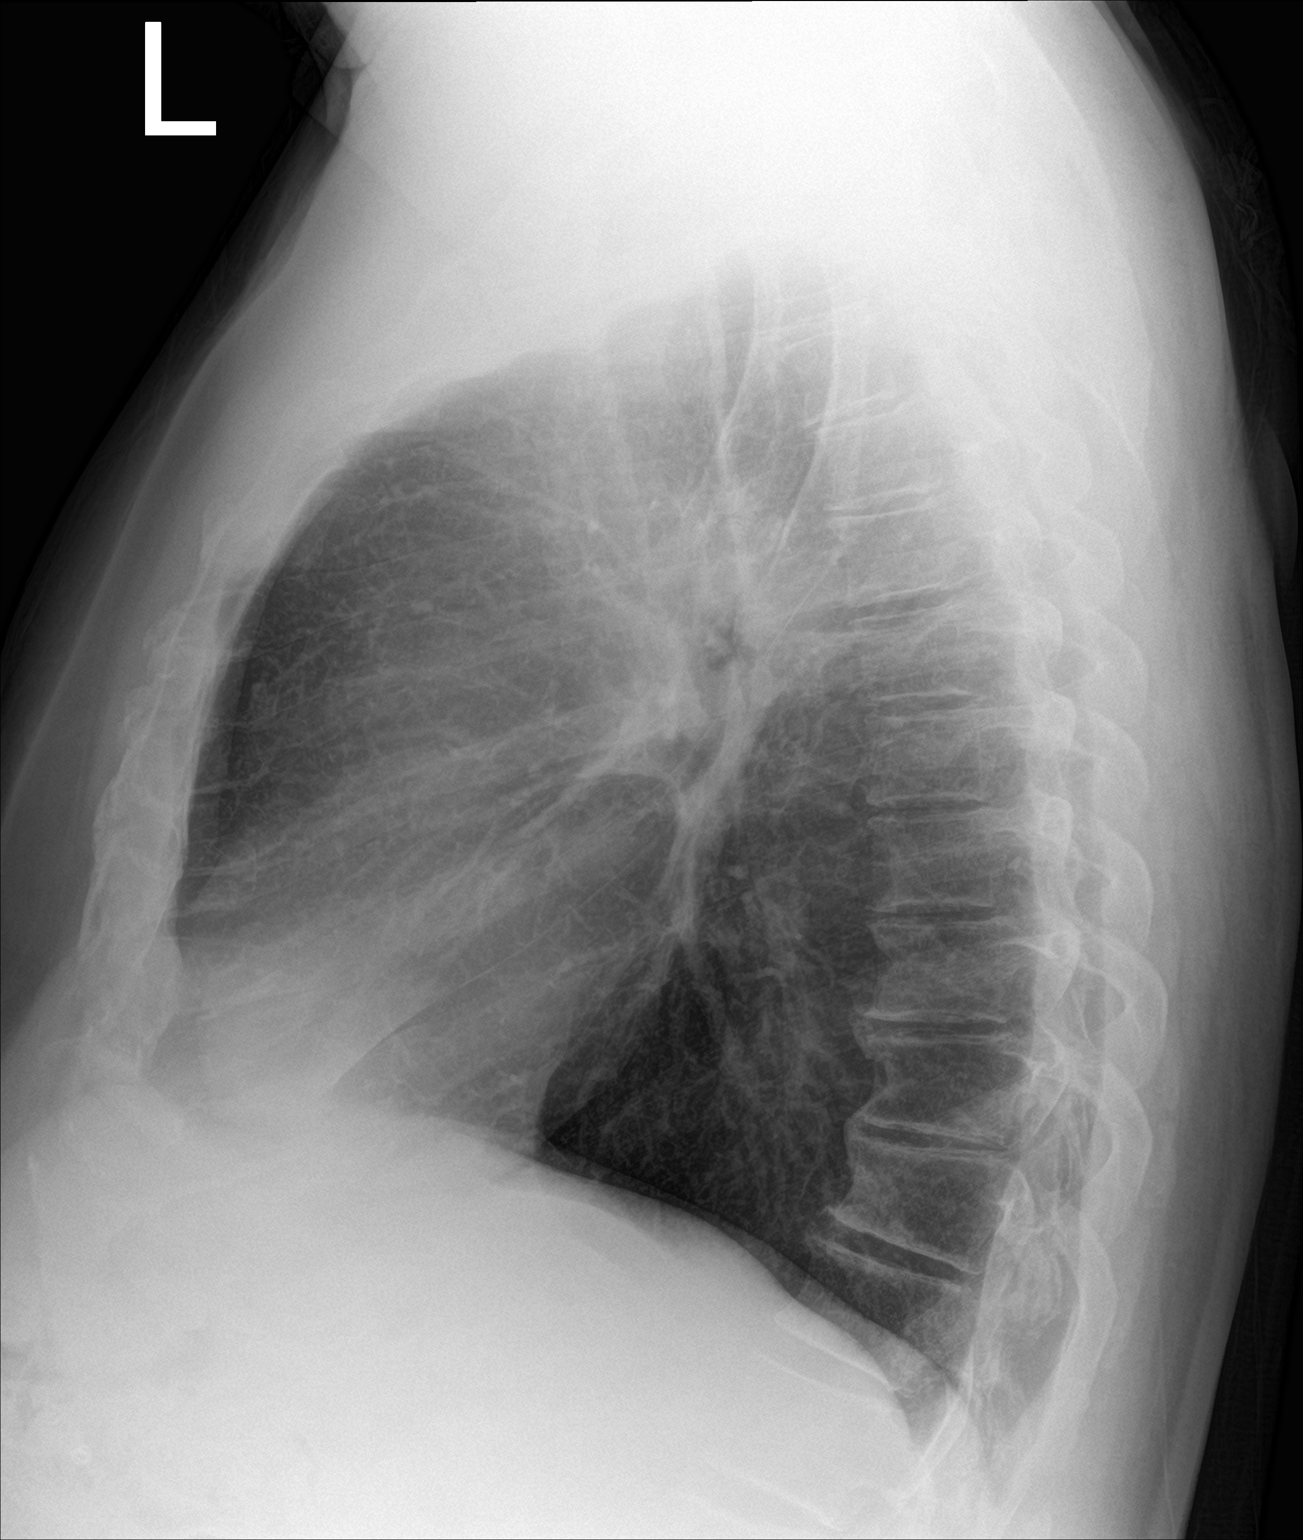

[2 of 2 positions shown; findings below may reference images not displayed]

FINDINGS: Mild bronchitic changes. No pleural effusion or focal consolidation.
Similar RIGHT hilar masslike density. Cardiomediastinal silhouette
is normal. No pneumothorax. Severe acromioclavicular osteoarthrosis
on the RIGHT. No destructive bony lesions.
IMPRESSION: Similar RIGHT hilar masslike density.  Mild bronchitic changes.

## 2016-05-19 ENCOUNTER — Other Ambulatory Visit: Payer: Self-pay | Admitting: Nurse Practitioner

## 2016-05-19 ENCOUNTER — Other Ambulatory Visit: Payer: Self-pay | Admitting: Family Medicine

## 2016-05-20 IMAGING — DX DG CHEST 2V
2 series · 2 of 2 positions shown · non-contrast
Comparison: 11/18/2015

CLINICAL DATA: Vomiting and diarrhea today. Fever for several days.
Chronic cough.

EXAM:
CHEST  2 VIEW

[chest pa]
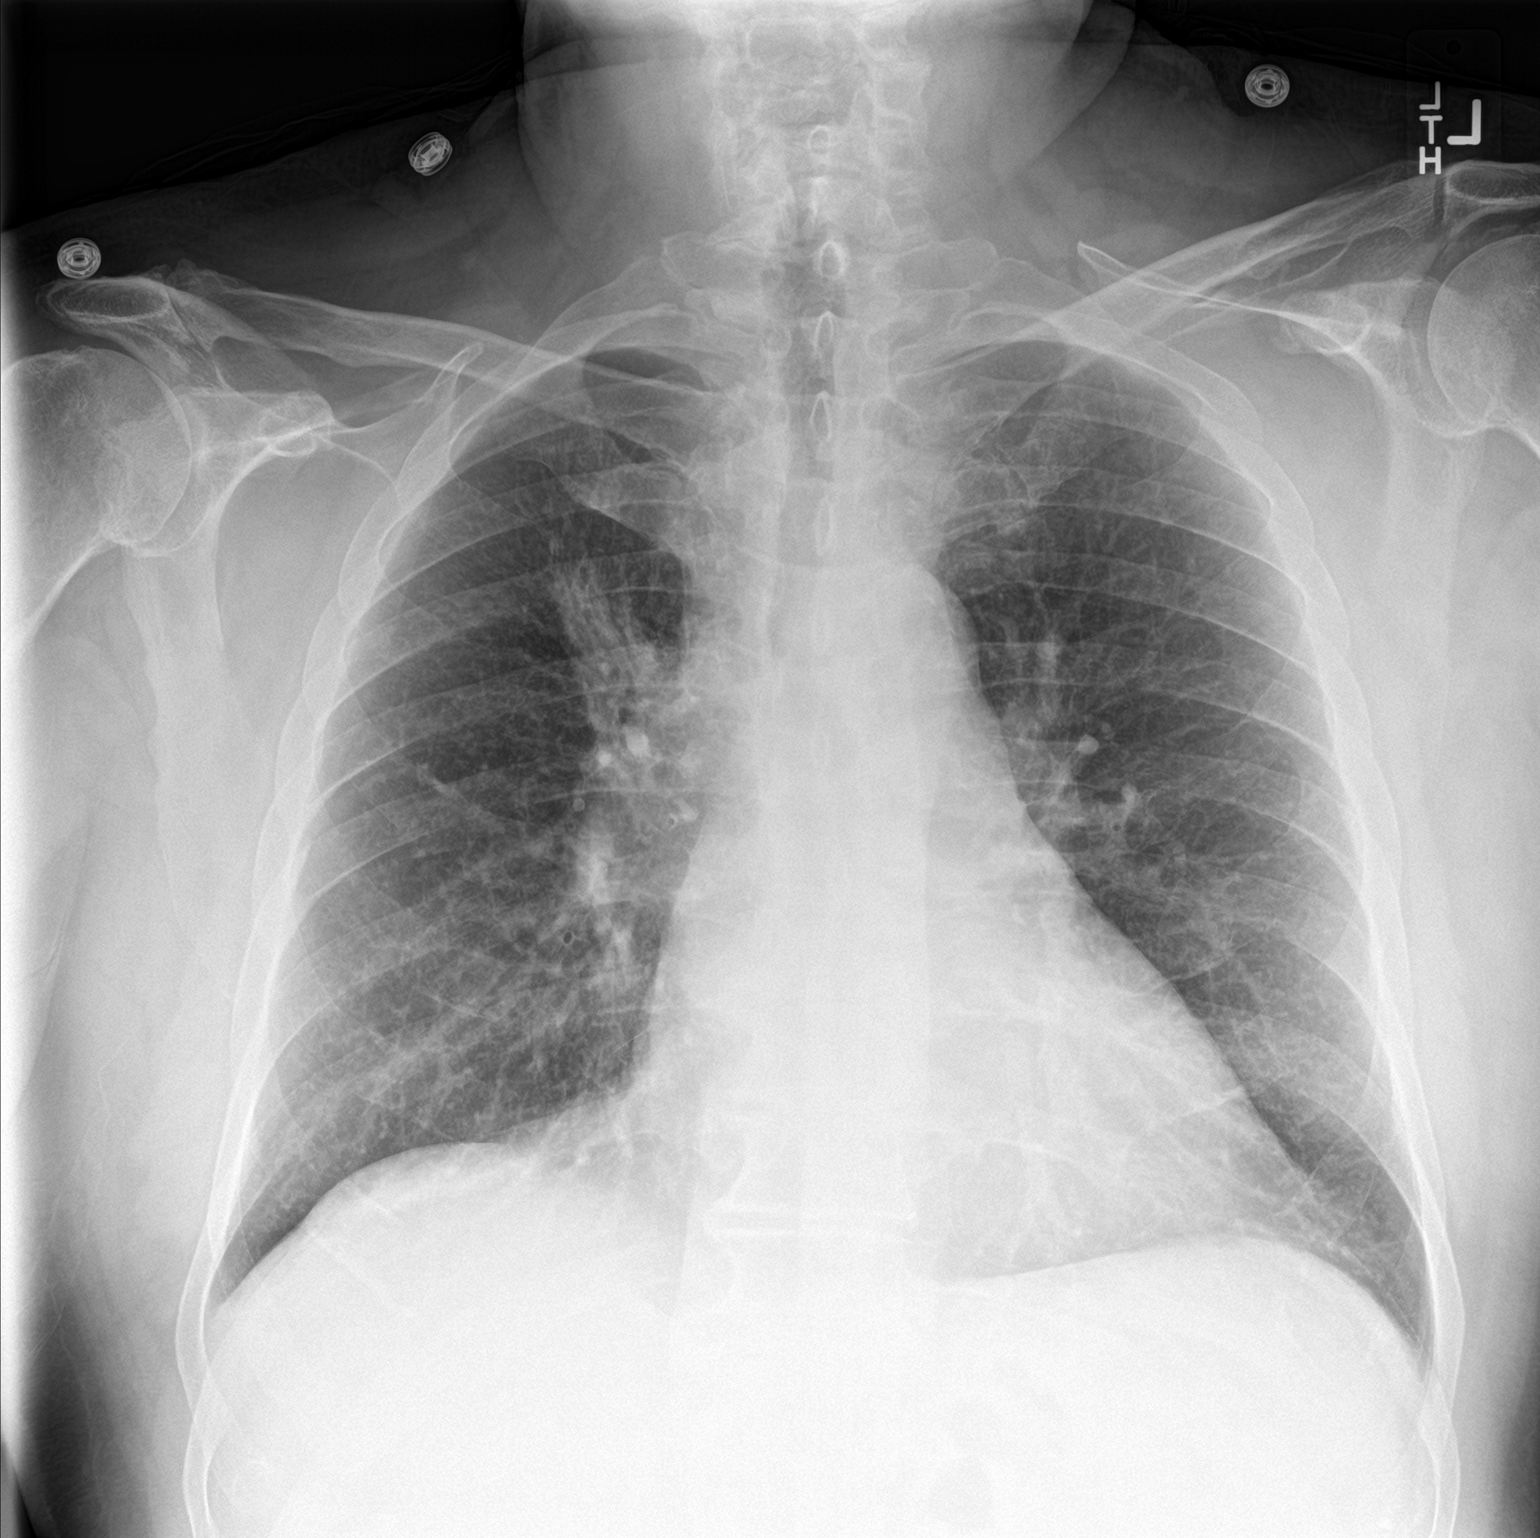

[chest lat]
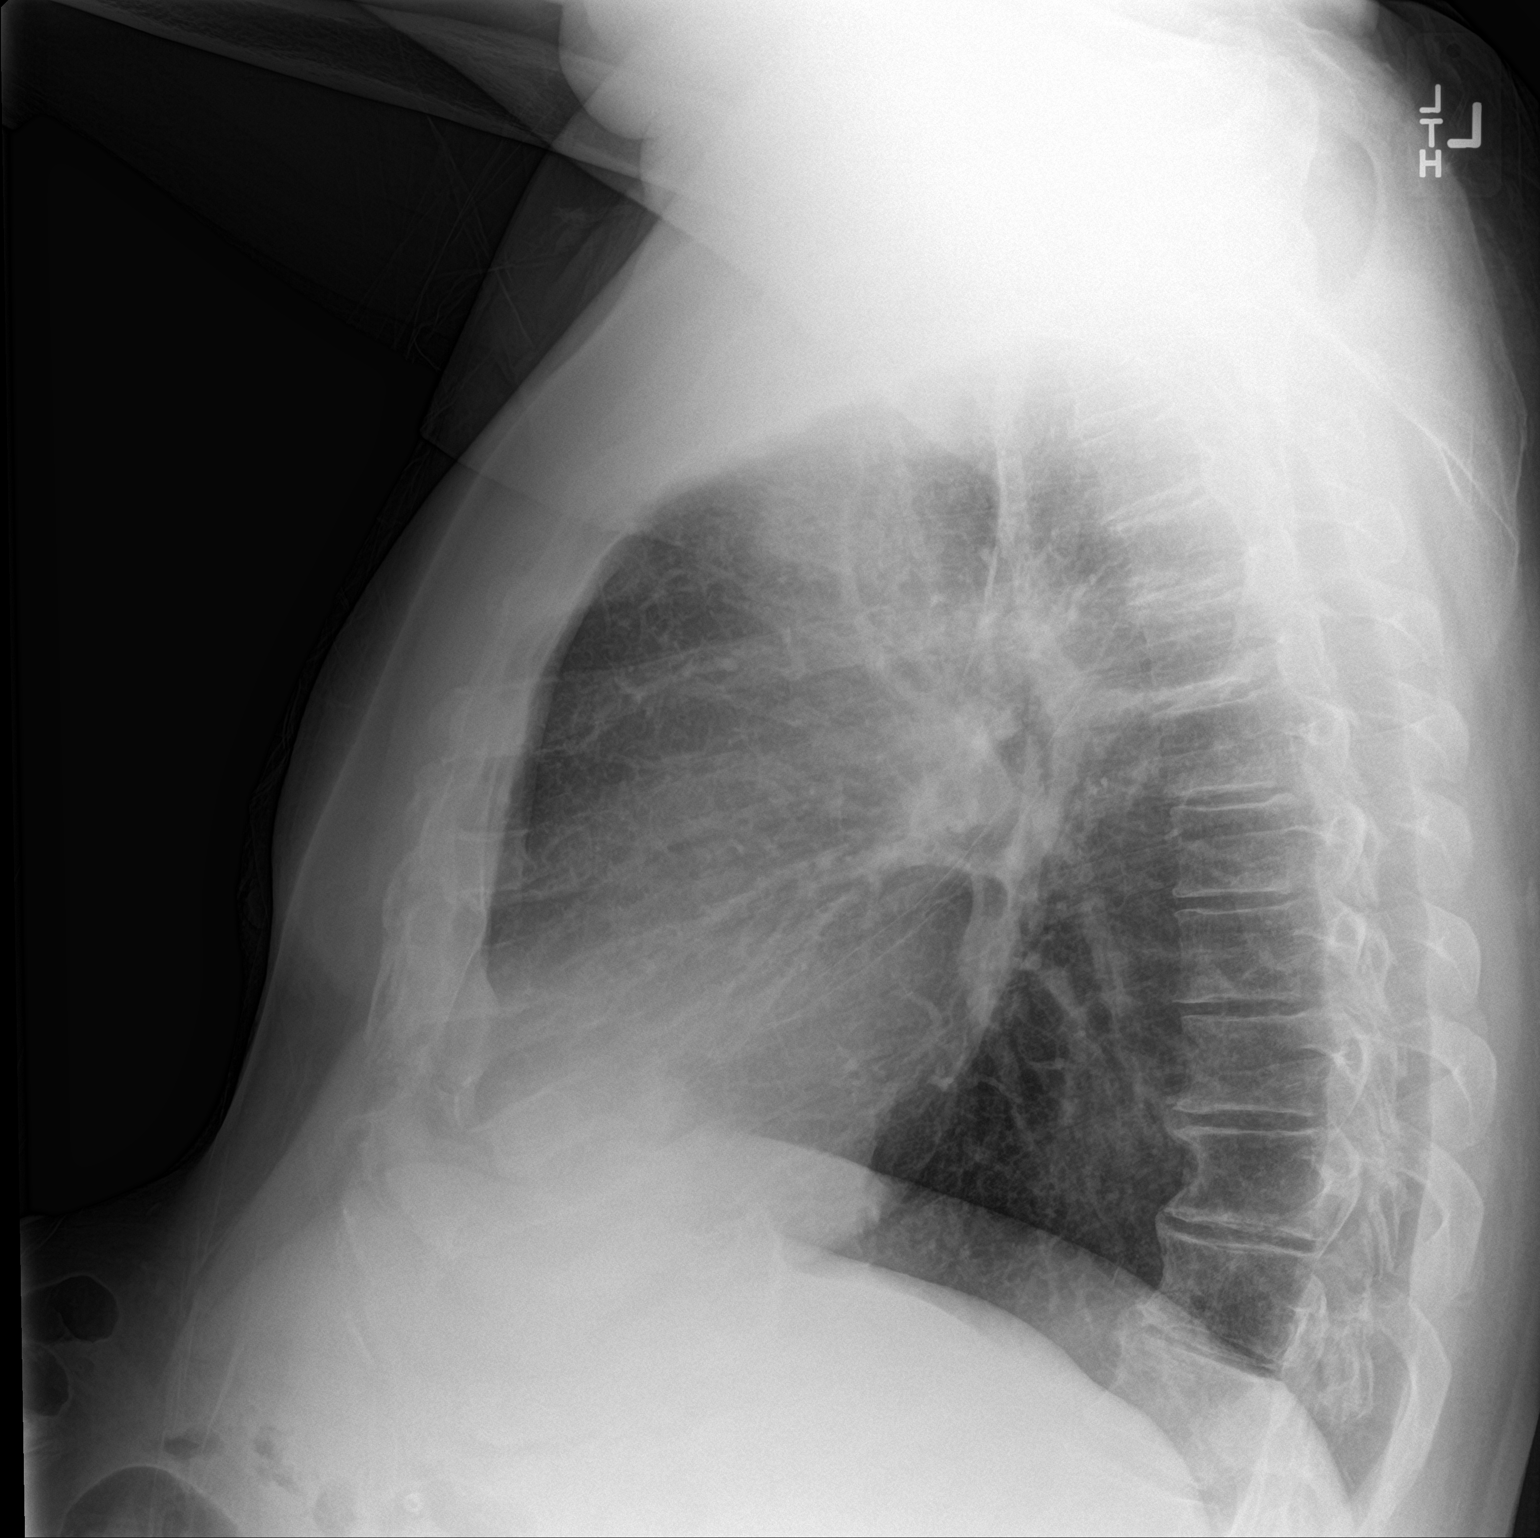

[2 of 2 positions shown; findings below may reference images not displayed]

FINDINGS: Right hilar and suprahilar mass and scarring. This appears similar
to previous study of mass in this region has been seen on previous
CT from 01/19/2014. Mild hyperinflation, likely emphysema. Normal
heart size and pulmonary vascularity. No focal consolidation or
airspace disease. No blunting of costophrenic angles. No
pneumothorax. Degenerative changes in the spine.
IMPRESSION: Persistent appearance of mass and scarring in the right upper lung
similar to previous study and likely representing known lesion.
Emphysematous changes in the chest. No developing consolidation.

## 2016-05-27 DIAGNOSIS — Z8551 Personal history of malignant neoplasm of bladder: Secondary | ICD-10-CM | POA: Diagnosis not present

## 2016-05-27 DIAGNOSIS — R911 Solitary pulmonary nodule: Secondary | ICD-10-CM | POA: Diagnosis not present

## 2016-05-27 DIAGNOSIS — R590 Localized enlarged lymph nodes: Secondary | ICD-10-CM | POA: Diagnosis not present

## 2016-05-27 DIAGNOSIS — Z85820 Personal history of malignant melanoma of skin: Secondary | ICD-10-CM | POA: Diagnosis not present

## 2016-05-27 DIAGNOSIS — Z6837 Body mass index (BMI) 37.0-37.9, adult: Secondary | ICD-10-CM | POA: Diagnosis not present

## 2016-05-27 DIAGNOSIS — K589 Irritable bowel syndrome without diarrhea: Secondary | ICD-10-CM | POA: Diagnosis not present

## 2016-05-27 DIAGNOSIS — N289 Disorder of kidney and ureter, unspecified: Secondary | ICD-10-CM | POA: Diagnosis not present

## 2016-05-27 DIAGNOSIS — D696 Thrombocytopenia, unspecified: Secondary | ICD-10-CM | POA: Diagnosis not present

## 2016-05-27 DIAGNOSIS — D3502 Benign neoplasm of left adrenal gland: Secondary | ICD-10-CM | POA: Diagnosis not present

## 2016-05-27 DIAGNOSIS — K769 Liver disease, unspecified: Secondary | ICD-10-CM | POA: Diagnosis not present

## 2016-05-27 DIAGNOSIS — C3411 Malignant neoplasm of upper lobe, right bronchus or lung: Secondary | ICD-10-CM | POA: Diagnosis not present

## 2016-05-28 DIAGNOSIS — H524 Presbyopia: Secondary | ICD-10-CM | POA: Diagnosis not present

## 2016-05-28 DIAGNOSIS — H2512 Age-related nuclear cataract, left eye: Secondary | ICD-10-CM | POA: Diagnosis not present

## 2016-05-28 DIAGNOSIS — Z961 Presence of intraocular lens: Secondary | ICD-10-CM | POA: Diagnosis not present

## 2016-06-10 DIAGNOSIS — Z8546 Personal history of malignant neoplasm of prostate: Secondary | ICD-10-CM | POA: Diagnosis not present

## 2016-06-10 DIAGNOSIS — K649 Unspecified hemorrhoids: Secondary | ICD-10-CM | POA: Diagnosis not present

## 2016-06-10 DIAGNOSIS — R5383 Other fatigue: Secondary | ICD-10-CM | POA: Diagnosis not present

## 2016-06-10 DIAGNOSIS — Z85858 Personal history of malignant neoplasm of other endocrine glands: Secondary | ICD-10-CM | POA: Diagnosis not present

## 2016-06-10 DIAGNOSIS — E785 Hyperlipidemia, unspecified: Secondary | ICD-10-CM | POA: Diagnosis not present

## 2016-06-10 DIAGNOSIS — C3411 Malignant neoplasm of upper lobe, right bronchus or lung: Secondary | ICD-10-CM | POA: Diagnosis not present

## 2016-06-10 DIAGNOSIS — Z79899 Other long term (current) drug therapy: Secondary | ICD-10-CM | POA: Diagnosis not present

## 2016-06-10 DIAGNOSIS — Z85820 Personal history of malignant melanoma of skin: Secondary | ICD-10-CM | POA: Diagnosis not present

## 2016-06-10 DIAGNOSIS — Z87891 Personal history of nicotine dependence: Secondary | ICD-10-CM | POA: Diagnosis not present

## 2016-06-10 DIAGNOSIS — Z8551 Personal history of malignant neoplasm of bladder: Secondary | ICD-10-CM | POA: Diagnosis not present

## 2016-06-13 DIAGNOSIS — K219 Gastro-esophageal reflux disease without esophagitis: Secondary | ICD-10-CM | POA: Diagnosis not present

## 2016-06-13 DIAGNOSIS — Z8546 Personal history of malignant neoplasm of prostate: Secondary | ICD-10-CM | POA: Diagnosis not present

## 2016-06-13 DIAGNOSIS — R0602 Shortness of breath: Secondary | ICD-10-CM | POA: Diagnosis not present

## 2016-06-13 DIAGNOSIS — C3491 Malignant neoplasm of unspecified part of right bronchus or lung: Secondary | ICD-10-CM | POA: Diagnosis not present

## 2016-06-13 DIAGNOSIS — Z8551 Personal history of malignant neoplasm of bladder: Secondary | ICD-10-CM | POA: Diagnosis not present

## 2016-06-16 DIAGNOSIS — C3491 Malignant neoplasm of unspecified part of right bronchus or lung: Secondary | ICD-10-CM | POA: Diagnosis not present

## 2016-06-16 DIAGNOSIS — Z8546 Personal history of malignant neoplasm of prostate: Secondary | ICD-10-CM | POA: Diagnosis not present

## 2016-06-16 DIAGNOSIS — R0602 Shortness of breath: Secondary | ICD-10-CM | POA: Diagnosis not present

## 2016-06-16 DIAGNOSIS — Z8551 Personal history of malignant neoplasm of bladder: Secondary | ICD-10-CM | POA: Diagnosis not present

## 2016-06-16 DIAGNOSIS — K219 Gastro-esophageal reflux disease without esophagitis: Secondary | ICD-10-CM | POA: Diagnosis not present

## 2016-06-18 DIAGNOSIS — K219 Gastro-esophageal reflux disease without esophagitis: Secondary | ICD-10-CM | POA: Diagnosis not present

## 2016-06-18 DIAGNOSIS — Z8546 Personal history of malignant neoplasm of prostate: Secondary | ICD-10-CM | POA: Diagnosis not present

## 2016-06-18 DIAGNOSIS — Z8551 Personal history of malignant neoplasm of bladder: Secondary | ICD-10-CM | POA: Diagnosis not present

## 2016-06-18 DIAGNOSIS — C3491 Malignant neoplasm of unspecified part of right bronchus or lung: Secondary | ICD-10-CM | POA: Diagnosis not present

## 2016-06-18 DIAGNOSIS — R0602 Shortness of breath: Secondary | ICD-10-CM | POA: Diagnosis not present

## 2016-06-27 ENCOUNTER — Other Ambulatory Visit: Payer: Self-pay | Admitting: *Deleted

## 2016-06-27 MED ORDER — AMLODIPINE BESYLATE 5 MG PO TABS: 5.0000 mg | ORAL_TABLET | Freq: Every day | ORAL | 0 refills | Status: AC

## 2016-06-30 ENCOUNTER — Other Ambulatory Visit: Payer: Self-pay

## 2016-06-30 DIAGNOSIS — C3491 Malignant neoplasm of unspecified part of right bronchus or lung: Secondary | ICD-10-CM | POA: Diagnosis not present

## 2016-06-30 DIAGNOSIS — R0602 Shortness of breath: Secondary | ICD-10-CM | POA: Diagnosis not present

## 2016-06-30 DIAGNOSIS — K219 Gastro-esophageal reflux disease without esophagitis: Secondary | ICD-10-CM | POA: Diagnosis not present

## 2016-06-30 DIAGNOSIS — Z8551 Personal history of malignant neoplasm of bladder: Secondary | ICD-10-CM | POA: Diagnosis not present

## 2016-06-30 DIAGNOSIS — Z8546 Personal history of malignant neoplasm of prostate: Secondary | ICD-10-CM | POA: Diagnosis not present

## 2016-06-30 MED ORDER — LISINOPRIL-HYDROCHLOROTHIAZIDE 20-12.5 MG PO TABS: 1.0000 | ORAL_TABLET | Freq: Every day | ORAL | 3 refills | Status: DC

## 2016-07-03 ENCOUNTER — Other Ambulatory Visit: Payer: Self-pay | Admitting: Family Medicine

## 2016-07-03 ENCOUNTER — Other Ambulatory Visit: Payer: Self-pay | Admitting: Nurse Practitioner

## 2016-07-03 DIAGNOSIS — K219 Gastro-esophageal reflux disease without esophagitis: Secondary | ICD-10-CM | POA: Diagnosis not present

## 2016-07-03 DIAGNOSIS — C3491 Malignant neoplasm of unspecified part of right bronchus or lung: Secondary | ICD-10-CM | POA: Diagnosis not present

## 2016-07-03 DIAGNOSIS — R0602 Shortness of breath: Secondary | ICD-10-CM | POA: Diagnosis not present

## 2016-07-03 DIAGNOSIS — Z8546 Personal history of malignant neoplasm of prostate: Secondary | ICD-10-CM | POA: Diagnosis not present

## 2016-07-03 DIAGNOSIS — Z8551 Personal history of malignant neoplasm of bladder: Secondary | ICD-10-CM | POA: Diagnosis not present

## 2016-07-06 DIAGNOSIS — Z8551 Personal history of malignant neoplasm of bladder: Secondary | ICD-10-CM | POA: Diagnosis not present

## 2016-07-06 DIAGNOSIS — C3491 Malignant neoplasm of unspecified part of right bronchus or lung: Secondary | ICD-10-CM | POA: Diagnosis not present

## 2016-07-06 DIAGNOSIS — K219 Gastro-esophageal reflux disease without esophagitis: Secondary | ICD-10-CM | POA: Diagnosis not present

## 2016-07-06 DIAGNOSIS — R0602 Shortness of breath: Secondary | ICD-10-CM | POA: Diagnosis not present

## 2016-07-06 DIAGNOSIS — Z8546 Personal history of malignant neoplasm of prostate: Secondary | ICD-10-CM | POA: Diagnosis not present

## 2016-08-05 DIAGNOSIS — C3490 Malignant neoplasm of unspecified part of unspecified bronchus or lung: Secondary | ICD-10-CM | POA: Diagnosis not present

## 2016-08-06 DIAGNOSIS — K219 Gastro-esophageal reflux disease without esophagitis: Secondary | ICD-10-CM | POA: Diagnosis not present

## 2016-08-06 DIAGNOSIS — Z8546 Personal history of malignant neoplasm of prostate: Secondary | ICD-10-CM | POA: Diagnosis not present

## 2016-08-06 DIAGNOSIS — R0602 Shortness of breath: Secondary | ICD-10-CM | POA: Diagnosis not present

## 2016-08-06 DIAGNOSIS — Z8551 Personal history of malignant neoplasm of bladder: Secondary | ICD-10-CM | POA: Diagnosis not present

## 2016-08-06 DIAGNOSIS — C3491 Malignant neoplasm of unspecified part of right bronchus or lung: Secondary | ICD-10-CM | POA: Diagnosis not present

## 2016-08-20 ENCOUNTER — Ambulatory Visit (INDEPENDENT_AMBULATORY_CARE_PROVIDER_SITE_OTHER)

## 2016-08-20 DIAGNOSIS — Z23 Encounter for immunization: Secondary | ICD-10-CM | POA: Diagnosis not present

## 2016-08-22 ENCOUNTER — Other Ambulatory Visit: Payer: Self-pay | Admitting: Nurse Practitioner

## 2016-09-03 DIAGNOSIS — K6289 Other specified diseases of anus and rectum: Secondary | ICD-10-CM | POA: Diagnosis not present

## 2016-09-05 DIAGNOSIS — Z8551 Personal history of malignant neoplasm of bladder: Secondary | ICD-10-CM | POA: Diagnosis not present

## 2016-09-05 DIAGNOSIS — Z8546 Personal history of malignant neoplasm of prostate: Secondary | ICD-10-CM | POA: Diagnosis not present

## 2016-09-05 DIAGNOSIS — R0602 Shortness of breath: Secondary | ICD-10-CM | POA: Diagnosis not present

## 2016-09-05 DIAGNOSIS — K219 Gastro-esophageal reflux disease without esophagitis: Secondary | ICD-10-CM | POA: Diagnosis not present

## 2016-09-05 DIAGNOSIS — C3491 Malignant neoplasm of unspecified part of right bronchus or lung: Secondary | ICD-10-CM | POA: Diagnosis not present

## 2016-09-19 DIAGNOSIS — K625 Hemorrhage of anus and rectum: Secondary | ICD-10-CM | POA: Diagnosis not present

## 2016-09-19 DIAGNOSIS — K6289 Other specified diseases of anus and rectum: Secondary | ICD-10-CM | POA: Diagnosis not present

## 2016-09-23 ENCOUNTER — Other Ambulatory Visit: Payer: Self-pay | Admitting: Nurse Practitioner

## 2016-09-30 ENCOUNTER — Other Ambulatory Visit: Payer: Self-pay

## 2016-09-30 MED ORDER — FENOFIBRATE 145 MG PO TABS: 145.0000 mg | ORAL_TABLET | Freq: Every day | ORAL | 0 refills | Status: AC

## 2016-10-06 DIAGNOSIS — Z8546 Personal history of malignant neoplasm of prostate: Secondary | ICD-10-CM | POA: Diagnosis not present

## 2016-10-06 DIAGNOSIS — K219 Gastro-esophageal reflux disease without esophagitis: Secondary | ICD-10-CM | POA: Diagnosis not present

## 2016-10-06 DIAGNOSIS — C3491 Malignant neoplasm of unspecified part of right bronchus or lung: Secondary | ICD-10-CM | POA: Diagnosis not present

## 2016-10-06 DIAGNOSIS — R0602 Shortness of breath: Secondary | ICD-10-CM | POA: Diagnosis not present

## 2016-10-06 DIAGNOSIS — Z8551 Personal history of malignant neoplasm of bladder: Secondary | ICD-10-CM | POA: Diagnosis not present

## 2016-10-08 DIAGNOSIS — C3491 Malignant neoplasm of unspecified part of right bronchus or lung: Secondary | ICD-10-CM | POA: Diagnosis not present

## 2016-10-08 DIAGNOSIS — Z8551 Personal history of malignant neoplasm of bladder: Secondary | ICD-10-CM | POA: Diagnosis not present

## 2016-10-08 DIAGNOSIS — R0602 Shortness of breath: Secondary | ICD-10-CM | POA: Diagnosis not present

## 2016-10-08 DIAGNOSIS — K219 Gastro-esophageal reflux disease without esophagitis: Secondary | ICD-10-CM | POA: Diagnosis not present

## 2016-10-08 DIAGNOSIS — Z8546 Personal history of malignant neoplasm of prostate: Secondary | ICD-10-CM | POA: Diagnosis not present

## 2016-10-10 DIAGNOSIS — R0602 Shortness of breath: Secondary | ICD-10-CM | POA: Diagnosis not present

## 2016-10-10 DIAGNOSIS — Z8551 Personal history of malignant neoplasm of bladder: Secondary | ICD-10-CM | POA: Diagnosis not present

## 2016-10-10 DIAGNOSIS — K219 Gastro-esophageal reflux disease without esophagitis: Secondary | ICD-10-CM | POA: Diagnosis not present

## 2016-10-10 DIAGNOSIS — C3491 Malignant neoplasm of unspecified part of right bronchus or lung: Secondary | ICD-10-CM | POA: Diagnosis not present

## 2016-10-10 DIAGNOSIS — Z8546 Personal history of malignant neoplasm of prostate: Secondary | ICD-10-CM | POA: Diagnosis not present

## 2016-10-14 DIAGNOSIS — R0602 Shortness of breath: Secondary | ICD-10-CM | POA: Diagnosis not present

## 2016-10-14 DIAGNOSIS — Z8551 Personal history of malignant neoplasm of bladder: Secondary | ICD-10-CM | POA: Diagnosis not present

## 2016-10-14 DIAGNOSIS — K219 Gastro-esophageal reflux disease without esophagitis: Secondary | ICD-10-CM | POA: Diagnosis not present

## 2016-10-14 DIAGNOSIS — Z8546 Personal history of malignant neoplasm of prostate: Secondary | ICD-10-CM | POA: Diagnosis not present

## 2016-10-14 DIAGNOSIS — C3491 Malignant neoplasm of unspecified part of right bronchus or lung: Secondary | ICD-10-CM | POA: Diagnosis not present

## 2016-10-28 DIAGNOSIS — C3491 Malignant neoplasm of unspecified part of right bronchus or lung: Secondary | ICD-10-CM | POA: Diagnosis not present

## 2016-10-28 DIAGNOSIS — R0602 Shortness of breath: Secondary | ICD-10-CM | POA: Diagnosis not present

## 2016-10-28 DIAGNOSIS — Z8546 Personal history of malignant neoplasm of prostate: Secondary | ICD-10-CM | POA: Diagnosis not present

## 2016-10-28 DIAGNOSIS — Z8551 Personal history of malignant neoplasm of bladder: Secondary | ICD-10-CM | POA: Diagnosis not present

## 2016-10-28 DIAGNOSIS — K219 Gastro-esophageal reflux disease without esophagitis: Secondary | ICD-10-CM | POA: Diagnosis not present

## 2016-10-29 DIAGNOSIS — J441 Chronic obstructive pulmonary disease with (acute) exacerbation: Secondary | ICD-10-CM | POA: Diagnosis not present

## 2016-10-29 DIAGNOSIS — Z01818 Encounter for other preprocedural examination: Secondary | ICD-10-CM | POA: Diagnosis not present

## 2016-10-29 DIAGNOSIS — K6289 Other specified diseases of anus and rectum: Secondary | ICD-10-CM | POA: Diagnosis not present

## 2016-10-29 DIAGNOSIS — J45901 Unspecified asthma with (acute) exacerbation: Secondary | ICD-10-CM | POA: Diagnosis not present

## 2016-10-29 DIAGNOSIS — K625 Hemorrhage of anus and rectum: Secondary | ICD-10-CM | POA: Diagnosis not present

## 2016-11-04 DIAGNOSIS — C3411 Malignant neoplasm of upper lobe, right bronchus or lung: Secondary | ICD-10-CM | POA: Diagnosis not present

## 2016-11-05 DIAGNOSIS — Z8551 Personal history of malignant neoplasm of bladder: Secondary | ICD-10-CM | POA: Diagnosis not present

## 2016-11-05 DIAGNOSIS — R0602 Shortness of breath: Secondary | ICD-10-CM | POA: Diagnosis not present

## 2016-11-05 DIAGNOSIS — C3491 Malignant neoplasm of unspecified part of right bronchus or lung: Secondary | ICD-10-CM | POA: Diagnosis not present

## 2016-11-05 DIAGNOSIS — Z8546 Personal history of malignant neoplasm of prostate: Secondary | ICD-10-CM | POA: Diagnosis not present

## 2016-11-05 DIAGNOSIS — K219 Gastro-esophageal reflux disease without esophagitis: Secondary | ICD-10-CM | POA: Diagnosis not present

## 2016-11-06 DIAGNOSIS — C3491 Malignant neoplasm of unspecified part of right bronchus or lung: Secondary | ICD-10-CM | POA: Diagnosis not present

## 2016-11-06 DIAGNOSIS — Z8546 Personal history of malignant neoplasm of prostate: Secondary | ICD-10-CM | POA: Diagnosis not present

## 2016-11-06 DIAGNOSIS — K219 Gastro-esophageal reflux disease without esophagitis: Secondary | ICD-10-CM | POA: Diagnosis not present

## 2016-11-06 DIAGNOSIS — R0602 Shortness of breath: Secondary | ICD-10-CM | POA: Diagnosis not present

## 2016-11-06 DIAGNOSIS — Z8551 Personal history of malignant neoplasm of bladder: Secondary | ICD-10-CM | POA: Diagnosis not present

## 2016-11-07 DIAGNOSIS — C3491 Malignant neoplasm of unspecified part of right bronchus or lung: Secondary | ICD-10-CM | POA: Diagnosis not present

## 2016-11-07 DIAGNOSIS — K219 Gastro-esophageal reflux disease without esophagitis: Secondary | ICD-10-CM | POA: Diagnosis not present

## 2016-11-07 DIAGNOSIS — Z8546 Personal history of malignant neoplasm of prostate: Secondary | ICD-10-CM | POA: Diagnosis not present

## 2016-11-07 DIAGNOSIS — Z8551 Personal history of malignant neoplasm of bladder: Secondary | ICD-10-CM | POA: Diagnosis not present

## 2016-11-07 DIAGNOSIS — R0602 Shortness of breath: Secondary | ICD-10-CM | POA: Diagnosis not present

## 2016-11-12 DIAGNOSIS — C3491 Malignant neoplasm of unspecified part of right bronchus or lung: Secondary | ICD-10-CM | POA: Diagnosis not present

## 2016-11-12 DIAGNOSIS — Z8546 Personal history of malignant neoplasm of prostate: Secondary | ICD-10-CM | POA: Diagnosis not present

## 2016-11-12 DIAGNOSIS — K219 Gastro-esophageal reflux disease without esophagitis: Secondary | ICD-10-CM | POA: Diagnosis not present

## 2016-11-12 DIAGNOSIS — Z8551 Personal history of malignant neoplasm of bladder: Secondary | ICD-10-CM | POA: Diagnosis not present

## 2016-11-12 DIAGNOSIS — R0602 Shortness of breath: Secondary | ICD-10-CM | POA: Diagnosis not present

## 2016-11-19 ENCOUNTER — Other Ambulatory Visit: Payer: Self-pay | Admitting: Physician Assistant

## 2016-11-19 DIAGNOSIS — Z8551 Personal history of malignant neoplasm of bladder: Secondary | ICD-10-CM | POA: Diagnosis not present

## 2016-11-19 DIAGNOSIS — C3491 Malignant neoplasm of unspecified part of right bronchus or lung: Secondary | ICD-10-CM | POA: Diagnosis not present

## 2016-11-19 DIAGNOSIS — K219 Gastro-esophageal reflux disease without esophagitis: Secondary | ICD-10-CM | POA: Diagnosis not present

## 2016-11-19 DIAGNOSIS — R0602 Shortness of breath: Secondary | ICD-10-CM | POA: Diagnosis not present

## 2016-11-19 DIAGNOSIS — Z8546 Personal history of malignant neoplasm of prostate: Secondary | ICD-10-CM | POA: Diagnosis not present

## 2016-11-19 MED ORDER — OSELTAMIVIR PHOSPHATE 75 MG PO CAPS: 75.0000 mg | ORAL_CAPSULE | Freq: Every day | ORAL | 0 refills | Status: AC

## 2016-11-21 ENCOUNTER — Other Ambulatory Visit: Payer: Self-pay | Admitting: Nurse Practitioner

## 2016-11-23 ENCOUNTER — Other Ambulatory Visit: Payer: Self-pay | Admitting: Nurse Practitioner

## 2016-11-25 DIAGNOSIS — K219 Gastro-esophageal reflux disease without esophagitis: Secondary | ICD-10-CM | POA: Diagnosis not present

## 2016-11-25 DIAGNOSIS — R0602 Shortness of breath: Secondary | ICD-10-CM | POA: Diagnosis not present

## 2016-11-25 DIAGNOSIS — C3491 Malignant neoplasm of unspecified part of right bronchus or lung: Secondary | ICD-10-CM | POA: Diagnosis not present

## 2016-11-25 DIAGNOSIS — Z8546 Personal history of malignant neoplasm of prostate: Secondary | ICD-10-CM | POA: Diagnosis not present

## 2016-11-25 DIAGNOSIS — Z8551 Personal history of malignant neoplasm of bladder: Secondary | ICD-10-CM | POA: Diagnosis not present

## 2016-11-27 DIAGNOSIS — Z8546 Personal history of malignant neoplasm of prostate: Secondary | ICD-10-CM | POA: Diagnosis not present

## 2016-11-27 DIAGNOSIS — R0602 Shortness of breath: Secondary | ICD-10-CM | POA: Diagnosis not present

## 2016-11-27 DIAGNOSIS — C3491 Malignant neoplasm of unspecified part of right bronchus or lung: Secondary | ICD-10-CM | POA: Diagnosis not present

## 2016-11-27 DIAGNOSIS — K219 Gastro-esophageal reflux disease without esophagitis: Secondary | ICD-10-CM | POA: Diagnosis not present

## 2016-11-27 DIAGNOSIS — Z8551 Personal history of malignant neoplasm of bladder: Secondary | ICD-10-CM | POA: Diagnosis not present

## 2016-12-02 DIAGNOSIS — K219 Gastro-esophageal reflux disease without esophagitis: Secondary | ICD-10-CM | POA: Diagnosis not present

## 2016-12-02 DIAGNOSIS — C3491 Malignant neoplasm of unspecified part of right bronchus or lung: Secondary | ICD-10-CM | POA: Diagnosis not present

## 2016-12-02 DIAGNOSIS — Z8546 Personal history of malignant neoplasm of prostate: Secondary | ICD-10-CM | POA: Diagnosis not present

## 2016-12-02 DIAGNOSIS — Z8551 Personal history of malignant neoplasm of bladder: Secondary | ICD-10-CM | POA: Diagnosis not present

## 2016-12-02 DIAGNOSIS — R0602 Shortness of breath: Secondary | ICD-10-CM | POA: Diagnosis not present

## 2016-12-03 DIAGNOSIS — R0602 Shortness of breath: Secondary | ICD-10-CM | POA: Diagnosis not present

## 2016-12-03 DIAGNOSIS — Z8551 Personal history of malignant neoplasm of bladder: Secondary | ICD-10-CM | POA: Diagnosis not present

## 2016-12-03 DIAGNOSIS — Z8546 Personal history of malignant neoplasm of prostate: Secondary | ICD-10-CM | POA: Diagnosis not present

## 2016-12-03 DIAGNOSIS — C3491 Malignant neoplasm of unspecified part of right bronchus or lung: Secondary | ICD-10-CM | POA: Diagnosis not present

## 2016-12-03 DIAGNOSIS — K219 Gastro-esophageal reflux disease without esophagitis: Secondary | ICD-10-CM | POA: Diagnosis not present

## 2016-12-04 DIAGNOSIS — R0602 Shortness of breath: Secondary | ICD-10-CM | POA: Diagnosis not present

## 2016-12-04 DIAGNOSIS — Z8546 Personal history of malignant neoplasm of prostate: Secondary | ICD-10-CM | POA: Diagnosis not present

## 2016-12-04 DIAGNOSIS — Z8551 Personal history of malignant neoplasm of bladder: Secondary | ICD-10-CM | POA: Diagnosis not present

## 2016-12-04 DIAGNOSIS — K219 Gastro-esophageal reflux disease without esophagitis: Secondary | ICD-10-CM | POA: Diagnosis not present

## 2016-12-04 DIAGNOSIS — C3491 Malignant neoplasm of unspecified part of right bronchus or lung: Secondary | ICD-10-CM | POA: Diagnosis not present

## 2016-12-09 DIAGNOSIS — K625 Hemorrhage of anus and rectum: Secondary | ICD-10-CM | POA: Diagnosis not present

## 2016-12-09 DIAGNOSIS — K648 Other hemorrhoids: Secondary | ICD-10-CM | POA: Diagnosis not present

## 2016-12-10 DIAGNOSIS — R0602 Shortness of breath: Secondary | ICD-10-CM | POA: Diagnosis not present

## 2016-12-10 DIAGNOSIS — Z8546 Personal history of malignant neoplasm of prostate: Secondary | ICD-10-CM | POA: Diagnosis not present

## 2016-12-10 DIAGNOSIS — C3491 Malignant neoplasm of unspecified part of right bronchus or lung: Secondary | ICD-10-CM | POA: Diagnosis not present

## 2016-12-10 DIAGNOSIS — K219 Gastro-esophageal reflux disease without esophagitis: Secondary | ICD-10-CM | POA: Diagnosis not present

## 2016-12-10 DIAGNOSIS — Z8551 Personal history of malignant neoplasm of bladder: Secondary | ICD-10-CM | POA: Diagnosis not present

## 2016-12-18 DIAGNOSIS — C3491 Malignant neoplasm of unspecified part of right bronchus or lung: Secondary | ICD-10-CM | POA: Diagnosis not present

## 2016-12-18 DIAGNOSIS — Z8546 Personal history of malignant neoplasm of prostate: Secondary | ICD-10-CM | POA: Diagnosis not present

## 2016-12-18 DIAGNOSIS — K219 Gastro-esophageal reflux disease without esophagitis: Secondary | ICD-10-CM | POA: Diagnosis not present

## 2016-12-18 DIAGNOSIS — Z8551 Personal history of malignant neoplasm of bladder: Secondary | ICD-10-CM | POA: Diagnosis not present

## 2016-12-18 DIAGNOSIS — R0602 Shortness of breath: Secondary | ICD-10-CM | POA: Diagnosis not present

## 2016-12-19 DIAGNOSIS — R0602 Shortness of breath: Secondary | ICD-10-CM | POA: Diagnosis not present

## 2016-12-19 DIAGNOSIS — C3491 Malignant neoplasm of unspecified part of right bronchus or lung: Secondary | ICD-10-CM | POA: Diagnosis not present

## 2016-12-19 DIAGNOSIS — Z8546 Personal history of malignant neoplasm of prostate: Secondary | ICD-10-CM | POA: Diagnosis not present

## 2016-12-19 DIAGNOSIS — K219 Gastro-esophageal reflux disease without esophagitis: Secondary | ICD-10-CM | POA: Diagnosis not present

## 2016-12-19 DIAGNOSIS — Z8551 Personal history of malignant neoplasm of bladder: Secondary | ICD-10-CM | POA: Diagnosis not present

## 2016-12-24 DIAGNOSIS — Z8551 Personal history of malignant neoplasm of bladder: Secondary | ICD-10-CM | POA: Diagnosis not present

## 2016-12-24 DIAGNOSIS — C3491 Malignant neoplasm of unspecified part of right bronchus or lung: Secondary | ICD-10-CM | POA: Diagnosis not present

## 2016-12-24 DIAGNOSIS — K219 Gastro-esophageal reflux disease without esophagitis: Secondary | ICD-10-CM | POA: Diagnosis not present

## 2016-12-24 DIAGNOSIS — R0602 Shortness of breath: Secondary | ICD-10-CM | POA: Diagnosis not present

## 2016-12-24 DIAGNOSIS — Z8546 Personal history of malignant neoplasm of prostate: Secondary | ICD-10-CM | POA: Diagnosis not present

## 2016-12-25 ENCOUNTER — Ambulatory Visit: Payer: Medicare Other | Admitting: Family Medicine

## 2016-12-31 DIAGNOSIS — C3491 Malignant neoplasm of unspecified part of right bronchus or lung: Secondary | ICD-10-CM | POA: Diagnosis not present

## 2016-12-31 DIAGNOSIS — Z8546 Personal history of malignant neoplasm of prostate: Secondary | ICD-10-CM | POA: Diagnosis not present

## 2016-12-31 DIAGNOSIS — K219 Gastro-esophageal reflux disease without esophagitis: Secondary | ICD-10-CM | POA: Diagnosis not present

## 2016-12-31 DIAGNOSIS — R0602 Shortness of breath: Secondary | ICD-10-CM | POA: Diagnosis not present

## 2016-12-31 DIAGNOSIS — Z8551 Personal history of malignant neoplasm of bladder: Secondary | ICD-10-CM | POA: Diagnosis not present

## 2017-01-01 DIAGNOSIS — C3491 Malignant neoplasm of unspecified part of right bronchus or lung: Secondary | ICD-10-CM | POA: Diagnosis not present

## 2017-01-01 DIAGNOSIS — Z8546 Personal history of malignant neoplasm of prostate: Secondary | ICD-10-CM | POA: Diagnosis not present

## 2017-01-01 DIAGNOSIS — K219 Gastro-esophageal reflux disease without esophagitis: Secondary | ICD-10-CM | POA: Diagnosis not present

## 2017-01-01 DIAGNOSIS — R0602 Shortness of breath: Secondary | ICD-10-CM | POA: Diagnosis not present

## 2017-01-01 DIAGNOSIS — Z8551 Personal history of malignant neoplasm of bladder: Secondary | ICD-10-CM | POA: Diagnosis not present

## 2017-01-04 DIAGNOSIS — C3491 Malignant neoplasm of unspecified part of right bronchus or lung: Secondary | ICD-10-CM | POA: Diagnosis not present

## 2017-01-04 DIAGNOSIS — Z8546 Personal history of malignant neoplasm of prostate: Secondary | ICD-10-CM | POA: Diagnosis not present

## 2017-01-04 DIAGNOSIS — R0602 Shortness of breath: Secondary | ICD-10-CM | POA: Diagnosis not present

## 2017-01-04 DIAGNOSIS — K219 Gastro-esophageal reflux disease without esophagitis: Secondary | ICD-10-CM | POA: Diagnosis not present

## 2017-01-04 DIAGNOSIS — Z8551 Personal history of malignant neoplasm of bladder: Secondary | ICD-10-CM | POA: Diagnosis not present

## 2017-01-08 DIAGNOSIS — Z8551 Personal history of malignant neoplasm of bladder: Secondary | ICD-10-CM | POA: Diagnosis not present

## 2017-01-08 DIAGNOSIS — R0602 Shortness of breath: Secondary | ICD-10-CM | POA: Diagnosis not present

## 2017-01-08 DIAGNOSIS — K219 Gastro-esophageal reflux disease without esophagitis: Secondary | ICD-10-CM | POA: Diagnosis not present

## 2017-01-08 DIAGNOSIS — C3491 Malignant neoplasm of unspecified part of right bronchus or lung: Secondary | ICD-10-CM | POA: Diagnosis not present

## 2017-01-08 DIAGNOSIS — Z8546 Personal history of malignant neoplasm of prostate: Secondary | ICD-10-CM | POA: Diagnosis not present

## 2017-01-15 DIAGNOSIS — Z8546 Personal history of malignant neoplasm of prostate: Secondary | ICD-10-CM | POA: Diagnosis not present

## 2017-01-15 DIAGNOSIS — K219 Gastro-esophageal reflux disease without esophagitis: Secondary | ICD-10-CM | POA: Diagnosis not present

## 2017-01-15 DIAGNOSIS — Z8551 Personal history of malignant neoplasm of bladder: Secondary | ICD-10-CM | POA: Diagnosis not present

## 2017-01-15 DIAGNOSIS — R0602 Shortness of breath: Secondary | ICD-10-CM | POA: Diagnosis not present

## 2017-01-15 DIAGNOSIS — C3491 Malignant neoplasm of unspecified part of right bronchus or lung: Secondary | ICD-10-CM | POA: Diagnosis not present

## 2017-01-22 DIAGNOSIS — K219 Gastro-esophageal reflux disease without esophagitis: Secondary | ICD-10-CM | POA: Diagnosis not present

## 2017-01-22 DIAGNOSIS — Z8551 Personal history of malignant neoplasm of bladder: Secondary | ICD-10-CM | POA: Diagnosis not present

## 2017-01-22 DIAGNOSIS — Z8546 Personal history of malignant neoplasm of prostate: Secondary | ICD-10-CM | POA: Diagnosis not present

## 2017-01-22 DIAGNOSIS — C3491 Malignant neoplasm of unspecified part of right bronchus or lung: Secondary | ICD-10-CM | POA: Diagnosis not present

## 2017-01-22 DIAGNOSIS — R0602 Shortness of breath: Secondary | ICD-10-CM | POA: Diagnosis not present

## 2017-01-29 DIAGNOSIS — R0602 Shortness of breath: Secondary | ICD-10-CM | POA: Diagnosis not present

## 2017-01-29 DIAGNOSIS — Z8551 Personal history of malignant neoplasm of bladder: Secondary | ICD-10-CM | POA: Diagnosis not present

## 2017-01-29 DIAGNOSIS — K219 Gastro-esophageal reflux disease without esophagitis: Secondary | ICD-10-CM | POA: Diagnosis not present

## 2017-01-29 DIAGNOSIS — Z8546 Personal history of malignant neoplasm of prostate: Secondary | ICD-10-CM | POA: Diagnosis not present

## 2017-01-29 DIAGNOSIS — C3491 Malignant neoplasm of unspecified part of right bronchus or lung: Secondary | ICD-10-CM | POA: Diagnosis not present

## 2017-02-03 DIAGNOSIS — Z8546 Personal history of malignant neoplasm of prostate: Secondary | ICD-10-CM | POA: Diagnosis not present

## 2017-02-03 DIAGNOSIS — K219 Gastro-esophageal reflux disease without esophagitis: Secondary | ICD-10-CM | POA: Diagnosis not present

## 2017-02-03 DIAGNOSIS — C3491 Malignant neoplasm of unspecified part of right bronchus or lung: Secondary | ICD-10-CM | POA: Diagnosis not present

## 2017-02-03 DIAGNOSIS — Z8551 Personal history of malignant neoplasm of bladder: Secondary | ICD-10-CM | POA: Diagnosis not present

## 2017-02-03 DIAGNOSIS — R0602 Shortness of breath: Secondary | ICD-10-CM | POA: Diagnosis not present

## 2017-02-04 DIAGNOSIS — C3491 Malignant neoplasm of unspecified part of right bronchus or lung: Secondary | ICD-10-CM | POA: Diagnosis not present

## 2017-02-04 DIAGNOSIS — Z8551 Personal history of malignant neoplasm of bladder: Secondary | ICD-10-CM | POA: Diagnosis not present

## 2017-02-04 DIAGNOSIS — Z8546 Personal history of malignant neoplasm of prostate: Secondary | ICD-10-CM | POA: Diagnosis not present

## 2017-02-04 DIAGNOSIS — K219 Gastro-esophageal reflux disease without esophagitis: Secondary | ICD-10-CM | POA: Diagnosis not present

## 2017-02-04 DIAGNOSIS — R0602 Shortness of breath: Secondary | ICD-10-CM | POA: Diagnosis not present

## 2017-02-07 ENCOUNTER — Emergency Department (HOSPITAL_COMMUNITY): Payer: Medicare Other

## 2017-02-07 ENCOUNTER — Encounter (HOSPITAL_COMMUNITY): Payer: Self-pay | Admitting: Emergency Medicine

## 2017-02-07 ENCOUNTER — Emergency Department (HOSPITAL_COMMUNITY)
Admission: EM | Admit: 2017-02-07 | Discharge: 2017-02-07 | Disposition: A | Discharge status: Home / Self Care | Payer: Medicare Other | Attending: Emergency Medicine | Admitting: Emergency Medicine

## 2017-02-07 DIAGNOSIS — I129 Hypertensive chronic kidney disease with stage 1 through stage 4 chronic kidney disease, or unspecified chronic kidney disease: Secondary | ICD-10-CM | POA: Diagnosis not present

## 2017-02-07 DIAGNOSIS — Z85820 Personal history of malignant melanoma of skin: Secondary | ICD-10-CM | POA: Diagnosis not present

## 2017-02-07 DIAGNOSIS — J449 Chronic obstructive pulmonary disease, unspecified: Secondary | ICD-10-CM | POA: Insufficient documentation

## 2017-02-07 DIAGNOSIS — Z8551 Personal history of malignant neoplasm of bladder: Secondary | ICD-10-CM | POA: Insufficient documentation

## 2017-02-07 DIAGNOSIS — W0110XA Fall on same level from slipping, tripping and stumbling with subsequent striking against unspecified object, initial encounter: Secondary | ICD-10-CM | POA: Diagnosis not present

## 2017-02-07 DIAGNOSIS — Z8546 Personal history of malignant neoplasm of prostate: Secondary | ICD-10-CM | POA: Diagnosis not present

## 2017-02-07 DIAGNOSIS — Z85118 Personal history of other malignant neoplasm of bronchus and lung: Secondary | ICD-10-CM | POA: Insufficient documentation

## 2017-02-07 DIAGNOSIS — Y999 Unspecified external cause status: Secondary | ICD-10-CM | POA: Diagnosis not present

## 2017-02-07 DIAGNOSIS — Z87891 Personal history of nicotine dependence: Secondary | ICD-10-CM | POA: Insufficient documentation

## 2017-02-07 DIAGNOSIS — N183 Chronic kidney disease, stage 3 (moderate): Secondary | ICD-10-CM | POA: Insufficient documentation

## 2017-02-07 DIAGNOSIS — Z79899 Other long term (current) drug therapy: Secondary | ICD-10-CM | POA: Insufficient documentation

## 2017-02-07 DIAGNOSIS — S4991XA Unspecified injury of right shoulder and upper arm, initial encounter: Secondary | ICD-10-CM | POA: Insufficient documentation

## 2017-02-07 DIAGNOSIS — Z85068 Personal history of other malignant neoplasm of small intestine: Secondary | ICD-10-CM | POA: Diagnosis not present

## 2017-02-07 DIAGNOSIS — Y929 Unspecified place or not applicable: Secondary | ICD-10-CM | POA: Diagnosis not present

## 2017-02-07 DIAGNOSIS — Y939 Activity, unspecified: Secondary | ICD-10-CM | POA: Diagnosis not present

## 2017-02-07 DIAGNOSIS — M79601 Pain in right arm: Secondary | ICD-10-CM | POA: Diagnosis not present

## 2017-02-07 MED ORDER — HYDROMORPHONE HCL 2 MG/ML IJ SOLN
2.0000 mg | Freq: Once | INTRAMUSCULAR | Status: AC
  Administered 2017-02-07: 2 mg via INTRAMUSCULAR
  Filled 2017-02-07: qty 1

## 2017-02-07 NOTE — ED Triage Notes (Signed)
Pt reports having a fall this morning.  States he has been having difficulty walking for the past few months and is currently on hospice for lung ca.  C/o right shoulder pain.

## 2017-02-07 NOTE — ED Notes (Signed)
Pt. Back from xray

## 2017-02-07 NOTE — ED Notes (Signed)
Pt. To x-ray .

## 2017-02-07 NOTE — ED Provider Notes (Signed)
[facility] DEPT Provider Note   CSN: [id number] Arrival date & time: 02/07/17  1139   By signing my name below, I, Hilbert Odor, attest that this documentation has been prepared under the direction and in the presence of Fredia Sorrow, MD. Electronically Signed: Hilbert Odor, Scribe. 02/07/17. 12:56 PM. History   Chief Complaint Chief Complaint  Patient presents with  . Fall    The history is provided by the patient. No language interpreter was used.  Fall  This is a new problem. The current episode started 3 to 5 hours ago. Associated symptoms include abdominal pain and shortness of breath. Pertinent negatives include no chest pain and no headaches.  HPI Comments: Scott Bird is a 71 y.o. male who presents to the Emergency Department complaining right arm pain s/p mechanical fall that occurred earlier today. He states that he has been having difficulty walking for the past few months. He accidentally fell earlier today and hit his right arm. He reports right arm pain currently. He states that he did not hit his head or loose consciousness after falling. He also reports having a cough with phlegm recently. He is not on any blood thinners. He is currently on hospice care for lung ca. He uses oxygen at home prn.  He denies any wrist or elbow pain.  Past Medical History:  Diagnosis Date  . Cancer (HCC)    prostate, bladder,duodenum,lung, melanoma   . Cataract   . Chronic kidney disease   . COPD (chronic obstructive pulmonary disease) [facility])     Patient Active Problem List   Diagnosis Date Noted  . Transient alteration of awareness 09/19/2015  . History of cancer 09/19/2015  . CKD (chronic kidney disease) stage 3, GFR 30-59 ml/min 08/21/2015  . Lung cancer ([place]) 08/20/2015  . Primary malignant neoplasm of right upper lobe of lung ([place]) 02/17/2014  . H/O prostate cancer 01/19/2014  . Hx of bladder cancer 01/19/2014  . 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  Other malaise and fatigue 01/19/2014  . Hypertension 06/30/2013  . Hyperlipidemia 06/30/2013  . GERD (gastroesophageal reflux disease) 06/30/2013  . Ulcerative colitis ([place]) 06/30/2013    Past Surgical History:  Procedure Laterality Date  . BLADDER REMOVAL    . EYE SURGERY Right    Catarct Extraction  . PROSTATECTOMY    . TONSILLECTOMY    . VASECTOMY         Home Medications    Prior to Admission medications   Medication Sig Start Date End Date Taking? Authorizing Provider  albuterol (PROVENTIL HFA;VENTOLIN HFA) 108 (90 BASE) MCG/ACT inhaler Inhale 2 puffs into the lungs every 6 (six) hours as needed for wheezing or shortness of breath. 04/24/15  Yes Gann, Tiffany A, PA-C  albuterol (PROVENTIL) (2.5 MG/3ML) 0.083% nebulizer solution Take 3 mLs (2.5 mg total) by nebulization every 6 (six) hours as needed for wheezing or shortness of breath. 03/11/16  Yes Chipper Herb, MD  amLODipine (NORVASC) 5 MG tablet Take 1 tablet (5 mg total) by mouth daily. 06/27/16  Yes Hassell Done, Mary-Margaret, FNP  atorvastatin (LIPITOR) 40 MG tablet TAKE ONE TABLET BY MOUTH ONCE DAILY 08/22/16  Yes Hassell Done, Mary-Margaret, FNP  fenofibrate (TRICOR) 145 MG tablet Take 1 tablet (145 mg total) by mouth daily. 09/30/16  Yes Martin, Mary-Margaret, FNP  hydrochlorothiazide (MICROZIDE) 12.5 MG capsule Take 12.5 mg by mouth daily.   Yes [provider]  LIALDA 1.2 g EC tablet TAKE ONE TABLET BY MOUTH ONCE DAILY WITH BREAKFAST 07/03/16  Yes Hassell Done, Mary-Margaret, FNP  omeprazole (PRILOSEC) 40 MG capsule TAKE ONE CAPSULE BY MOUTH ONCE DAILY 07/03/16  Yes Chevis Pretty, FNP    Family History Family History  Problem Relation Age of Onset  . Cancer Father     colon, kidney  . Stroke Father   . Congestive Heart Failure Father   . Diabetes Mother   . Cancer Brother     melanoma    Social History Social History  Substance Use Topics  . Smoking status: Former Research scientist (life sciences)  . Smokeless tobacco: Never Used    . Alcohol use 0.0 oz/week     Comment: Occ     Allergies   Patient has no known allergies.   Review of Systems Review of Systems  Constitutional: Positive for fever.  HENT: Positive for congestion.   Eyes: Positive for visual disturbance.  Respiratory: Positive for cough and shortness of breath.   Cardiovascular: Positive for leg swelling. Negative for chest pain.  Gastrointestinal: Positive for abdominal pain. Negative for diarrhea, nausea and vomiting.  Genitourinary: Negative for dysuria and hematuria.  Musculoskeletal: Negative for back pain and joint swelling.  Skin: Positive for rash.  Neurological: Negative for headaches.  Hematological: Does not bruise/bleed easily.  Psychiatric/Behavioral: Negative for confusion.  All other systems reviewed and are negative.    Physical Exam Updated Vital Signs BP (!) 150/56 (BP Location: Left Arm)   Pulse 98   Temp 98 F (36.7 C) (Oral)   Resp 18   Ht '5\' 9"'$  (1.753 m)   Wt 113.4 kg   SpO2 96%   BMI 36.92 kg/m   Physical Exam  Constitutional: He is oriented to person, place, and time. He appears well-developed and well-nourished.  HENT:  Head: Normocephalic and atraumatic.  Eyes: EOM are normal.  Pupils normal. Sclera clear. Eyes tracking normal  Neck: Normal range of motion.  Cardiovascular: Normal rate, regular rhythm, normal heart sounds and intact distal pulses.   Hearts regular.  Pulmonary/Chest: Effort normal and breath sounds normal. No respiratory distress.  Bilateral wheezing. O2 sats 94%.   Abdominal: Soft. He exhibits no distension. There is no tenderness.  Abdomen is non-tender.   Musculoskeletal: Normal range of motion.  Right arm: Radial pulse in rt arm 2+. Non-tender at the elbow. No obvious deformity at the shoulder and some tenderness at the shoulder area.  No pitting edema in the ankles currently.   Neurological: He is alert and oriented to person, place, and time. No cranial nerve deficit or  sensory deficit. He exhibits normal muscle tone. Coordination normal.  Skin: Skin is warm and dry.  Psychiatric: He has a normal mood and affect. Judgment normal.  Nursing note and vitals reviewed.  ED Treatments / Results  DIAGNOSTIC STUDIES: Oxygen Saturation is 96% on RA, adequate by my interpretation.    COORDINATION OF CARE: 12:45 PM Discussed treatment plan with pt at bedside and pt agreed to plan. I will check the patient x-ray.  Labs (all labs ordered are listed, but only abnormal results are displayed) Labs Reviewed - No data to display  EKG  EKG Interpretation None       Radiology Dg Humerus Right  Result Date: 02/07/2017 CLINICAL DATA:  Initial encounter for Pain in rt upper arm due to fall EXAM: RIGHT HUMERUS - 2+ VIEW COMPARISON:  None. FINDINGS: No acute fracture or dislocation. Degenerate changes of the acromioclavicular joint. IMPRESSION: No acute osseous abnormality. Electronically Signed   By: Abigail Miyamoto M.D.   On: 02/07/2017  12:54    Procedures Procedures (including critical care time)  Medications Ordered in ED Medications  HYDROmorphone (DILAUDID) injection 2 mg (not administered)     Initial Impression / Assessment and Plan / ED Course  I have reviewed the triage vital signs and the nursing notes.  Pertinent labs & imaging results that were available during my care of the patient were reviewed by me and considered in my medical decision making (see chart for details).     X-ray of the right humerus area without the any bony injuries. No dislocation. May be muscular or rotator cuff in nature. We'll treat with a sling and have him follow with orthopedics if not improved in a week. Patient has pain medication at home. Patient is on hospice care. No significant neuro deficits distally up to the right arm.  Final Clinical Impressions(s) / ED Diagnoses   Final diagnoses:  Injury of right upper extremity, initial encounter    New Prescriptions New  Prescriptions   No medications on file   I personally performed the services described in this documentation, which was scribed in my presence. The recorded information has been reviewed and is accurate.      Fredia Sorrow, MD 02/07/17 [phone]

## 2017-02-07 NOTE — Discharge Instructions (Signed)
Wear the sling for comfort. Follow-up with orthopedics if not improving in a week. Return for any new or worse symptoms. X-rays today without any bony injury. Take pain medication as needed.

## 2017-02-09 DIAGNOSIS — C3491 Malignant neoplasm of unspecified part of right bronchus or lung: Secondary | ICD-10-CM | POA: Diagnosis not present

## 2017-02-09 DIAGNOSIS — Z8546 Personal history of malignant neoplasm of prostate: Secondary | ICD-10-CM | POA: Diagnosis not present

## 2017-02-09 DIAGNOSIS — K219 Gastro-esophageal reflux disease without esophagitis: Secondary | ICD-10-CM | POA: Diagnosis not present

## 2017-02-09 DIAGNOSIS — R0602 Shortness of breath: Secondary | ICD-10-CM | POA: Diagnosis not present

## 2017-02-09 DIAGNOSIS — Z8551 Personal history of malignant neoplasm of bladder: Secondary | ICD-10-CM | POA: Diagnosis not present

## 2017-02-10 DIAGNOSIS — C3491 Malignant neoplasm of unspecified part of right bronchus or lung: Secondary | ICD-10-CM | POA: Diagnosis not present

## 2017-02-10 DIAGNOSIS — Z8546 Personal history of malignant neoplasm of prostate: Secondary | ICD-10-CM | POA: Diagnosis not present

## 2017-02-10 DIAGNOSIS — K219 Gastro-esophageal reflux disease without esophagitis: Secondary | ICD-10-CM | POA: Diagnosis not present

## 2017-02-10 DIAGNOSIS — R0602 Shortness of breath: Secondary | ICD-10-CM | POA: Diagnosis not present

## 2017-02-10 DIAGNOSIS — Z8551 Personal history of malignant neoplasm of bladder: Secondary | ICD-10-CM | POA: Diagnosis not present

## 2017-02-11 DIAGNOSIS — Z8546 Personal history of malignant neoplasm of prostate: Secondary | ICD-10-CM | POA: Diagnosis not present

## 2017-02-11 DIAGNOSIS — K219 Gastro-esophageal reflux disease without esophagitis: Secondary | ICD-10-CM | POA: Diagnosis not present

## 2017-02-11 DIAGNOSIS — R0602 Shortness of breath: Secondary | ICD-10-CM | POA: Diagnosis not present

## 2017-02-11 DIAGNOSIS — Z8551 Personal history of malignant neoplasm of bladder: Secondary | ICD-10-CM | POA: Diagnosis not present

## 2017-02-11 DIAGNOSIS — C3491 Malignant neoplasm of unspecified part of right bronchus or lung: Secondary | ICD-10-CM | POA: Diagnosis not present

## 2017-02-12 DIAGNOSIS — Z8546 Personal history of malignant neoplasm of prostate: Secondary | ICD-10-CM | POA: Diagnosis not present

## 2017-02-12 DIAGNOSIS — Z8551 Personal history of malignant neoplasm of bladder: Secondary | ICD-10-CM | POA: Diagnosis not present

## 2017-02-12 DIAGNOSIS — K219 Gastro-esophageal reflux disease without esophagitis: Secondary | ICD-10-CM | POA: Diagnosis not present

## 2017-02-12 DIAGNOSIS — C3491 Malignant neoplasm of unspecified part of right bronchus or lung: Secondary | ICD-10-CM | POA: Diagnosis not present

## 2017-02-12 DIAGNOSIS — R0602 Shortness of breath: Secondary | ICD-10-CM | POA: Diagnosis not present

## 2017-02-13 DIAGNOSIS — Z8551 Personal history of malignant neoplasm of bladder: Secondary | ICD-10-CM | POA: Diagnosis not present

## 2017-02-13 DIAGNOSIS — Z8546 Personal history of malignant neoplasm of prostate: Secondary | ICD-10-CM | POA: Diagnosis not present

## 2017-02-13 DIAGNOSIS — R0602 Shortness of breath: Secondary | ICD-10-CM | POA: Diagnosis not present

## 2017-02-13 DIAGNOSIS — K219 Gastro-esophageal reflux disease without esophagitis: Secondary | ICD-10-CM | POA: Diagnosis not present

## 2017-02-13 DIAGNOSIS — C3491 Malignant neoplasm of unspecified part of right bronchus or lung: Secondary | ICD-10-CM | POA: Diagnosis not present

## 2017-02-14 DIAGNOSIS — Z8551 Personal history of malignant neoplasm of bladder: Secondary | ICD-10-CM | POA: Diagnosis not present

## 2017-02-14 DIAGNOSIS — K219 Gastro-esophageal reflux disease without esophagitis: Secondary | ICD-10-CM | POA: Diagnosis not present

## 2017-02-14 DIAGNOSIS — C3491 Malignant neoplasm of unspecified part of right bronchus or lung: Secondary | ICD-10-CM | POA: Diagnosis not present

## 2017-02-14 DIAGNOSIS — Z8546 Personal history of malignant neoplasm of prostate: Secondary | ICD-10-CM | POA: Diagnosis not present

## 2017-02-14 DIAGNOSIS — R0602 Shortness of breath: Secondary | ICD-10-CM | POA: Diagnosis not present

## 2017-02-15 DIAGNOSIS — K219 Gastro-esophageal reflux disease without esophagitis: Secondary | ICD-10-CM | POA: Diagnosis not present

## 2017-02-15 DIAGNOSIS — C3491 Malignant neoplasm of unspecified part of right bronchus or lung: Secondary | ICD-10-CM | POA: Diagnosis not present

## 2017-02-15 DIAGNOSIS — Z8551 Personal history of malignant neoplasm of bladder: Secondary | ICD-10-CM | POA: Diagnosis not present

## 2017-02-15 DIAGNOSIS — Z8546 Personal history of malignant neoplasm of prostate: Secondary | ICD-10-CM | POA: Diagnosis not present

## 2017-02-15 DIAGNOSIS — R0602 Shortness of breath: Secondary | ICD-10-CM | POA: Diagnosis not present

## 2017-02-16 DIAGNOSIS — R0602 Shortness of breath: Secondary | ICD-10-CM | POA: Diagnosis not present

## 2017-02-16 DIAGNOSIS — Z8551 Personal history of malignant neoplasm of bladder: Secondary | ICD-10-CM | POA: Diagnosis not present

## 2017-02-16 DIAGNOSIS — Z8546 Personal history of malignant neoplasm of prostate: Secondary | ICD-10-CM | POA: Diagnosis not present

## 2017-02-16 DIAGNOSIS — K219 Gastro-esophageal reflux disease without esophagitis: Secondary | ICD-10-CM | POA: Diagnosis not present

## 2017-02-16 DIAGNOSIS — C3491 Malignant neoplasm of unspecified part of right bronchus or lung: Secondary | ICD-10-CM | POA: Diagnosis not present

## 2017-02-17 DIAGNOSIS — Z8551 Personal history of malignant neoplasm of bladder: Secondary | ICD-10-CM | POA: Diagnosis not present

## 2017-02-17 DIAGNOSIS — K219 Gastro-esophageal reflux disease without esophagitis: Secondary | ICD-10-CM | POA: Diagnosis not present

## 2017-02-17 DIAGNOSIS — C3491 Malignant neoplasm of unspecified part of right bronchus or lung: Secondary | ICD-10-CM | POA: Diagnosis not present

## 2017-02-17 DIAGNOSIS — Z8546 Personal history of malignant neoplasm of prostate: Secondary | ICD-10-CM | POA: Diagnosis not present

## 2017-02-17 DIAGNOSIS — R0602 Shortness of breath: Secondary | ICD-10-CM | POA: Diagnosis not present

## 2017-02-18 DIAGNOSIS — R0602 Shortness of breath: Secondary | ICD-10-CM | POA: Diagnosis not present

## 2017-02-18 DIAGNOSIS — Z8546 Personal history of malignant neoplasm of prostate: Secondary | ICD-10-CM | POA: Diagnosis not present

## 2017-02-18 DIAGNOSIS — C3491 Malignant neoplasm of unspecified part of right bronchus or lung: Secondary | ICD-10-CM | POA: Diagnosis not present

## 2017-02-18 DIAGNOSIS — Z8551 Personal history of malignant neoplasm of bladder: Secondary | ICD-10-CM | POA: Diagnosis not present

## 2017-02-18 DIAGNOSIS — K219 Gastro-esophageal reflux disease without esophagitis: Secondary | ICD-10-CM | POA: Diagnosis not present

## 2017-02-19 DIAGNOSIS — R0602 Shortness of breath: Secondary | ICD-10-CM | POA: Diagnosis not present

## 2017-02-19 DIAGNOSIS — C3491 Malignant neoplasm of unspecified part of right bronchus or lung: Secondary | ICD-10-CM | POA: Diagnosis not present

## 2017-02-19 DIAGNOSIS — Z8546 Personal history of malignant neoplasm of prostate: Secondary | ICD-10-CM | POA: Diagnosis not present

## 2017-02-19 DIAGNOSIS — Z8551 Personal history of malignant neoplasm of bladder: Secondary | ICD-10-CM | POA: Diagnosis not present

## 2017-02-19 DIAGNOSIS — K219 Gastro-esophageal reflux disease without esophagitis: Secondary | ICD-10-CM | POA: Diagnosis not present

## 2017-03-06 DEATH — deceased
